# Patient Record
Sex: Male | Born: 1975 | Race: White | Hispanic: No | Marital: Married
Health system: Southern US, Community
[De-identification: ages and names within clinical notes are randomized; demographics above are authoritative.]

## PROBLEM LIST (undated history)

## (undated) DIAGNOSIS — I10 Essential (primary) hypertension: Secondary | ICD-10-CM

## (undated) DIAGNOSIS — N289 Disorder of kidney and ureter, unspecified: Secondary | ICD-10-CM

## (undated) HISTORY — PX: COLON SURGERY: SHX602

---

## 2005-02-26 ENCOUNTER — Ambulatory Visit: Payer: Self-pay | Admitting: General Surgery

## 2007-11-24 ENCOUNTER — Ambulatory Visit: Payer: Self-pay | Admitting: Unknown Physician Specialty

## 2009-10-18 ENCOUNTER — Ambulatory Visit: Payer: Self-pay | Admitting: Internal Medicine

## 2010-03-10 ENCOUNTER — Ambulatory Visit: Payer: Self-pay | Admitting: Gastroenterology

## 2010-05-23 ENCOUNTER — Ambulatory Visit: Payer: Self-pay | Admitting: Gastroenterology

## 2010-09-18 ENCOUNTER — Emergency Department: Payer: Self-pay | Admitting: Emergency Medicine

## 2010-10-10 ENCOUNTER — Ambulatory Visit: Payer: Self-pay | Admitting: Gastroenterology

## 2010-10-24 ENCOUNTER — Ambulatory Visit: Payer: Self-pay | Admitting: Gastroenterology

## 2010-10-28 LAB — PATHOLOGY REPORT

## 2010-11-02 ENCOUNTER — Ambulatory Visit: Payer: Self-pay | Admitting: Surgery

## 2010-11-09 ENCOUNTER — Inpatient Hospital Stay: Payer: Self-pay | Admitting: Surgery

## 2010-11-14 LAB — PATHOLOGY REPORT

## 2011-05-04 IMAGING — CT CT ABD-PELV W/ CM
1 of 2 series · 15 of 32 positions shown, 19 images · non-contrast
Comparison: none

REASON FOR EXAM: ADD ON CR  [id number]   [id number]   LLQ abd pain eval
diverticulitis
COMMENTS:

[Series 2: 3mm soft tissue · axial · 0.82mm/px · z∈[-639,-216]mm · 15 of 155 slices shown, 19 images]
[im 7/155  soft-tissue]
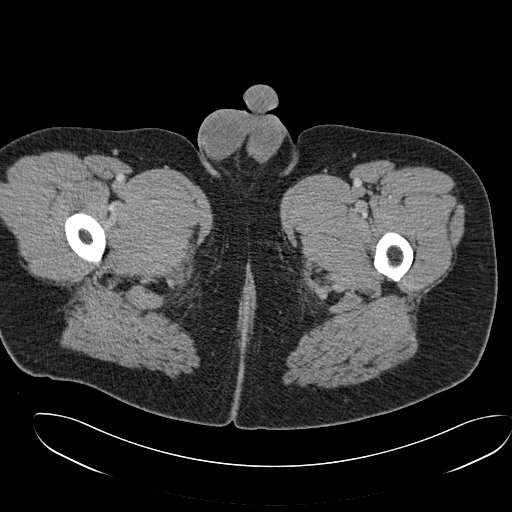
[im 7/155  bone]
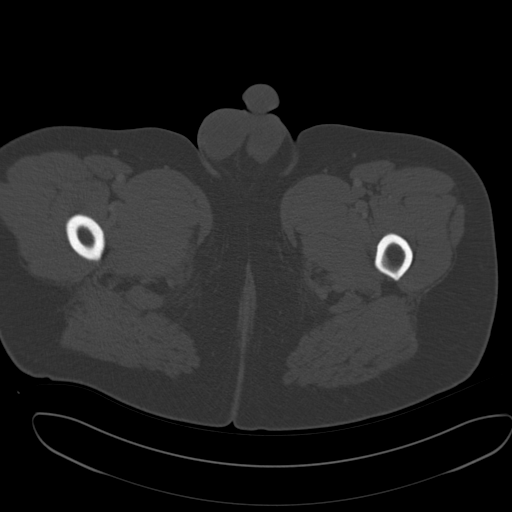
[im 20/155  soft-tissue]
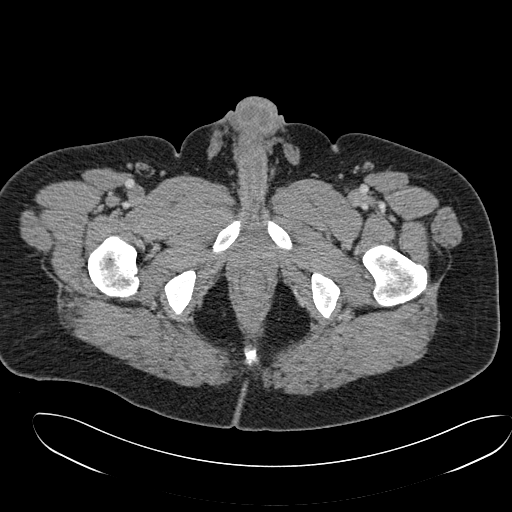
[im 33/155  soft-tissue]
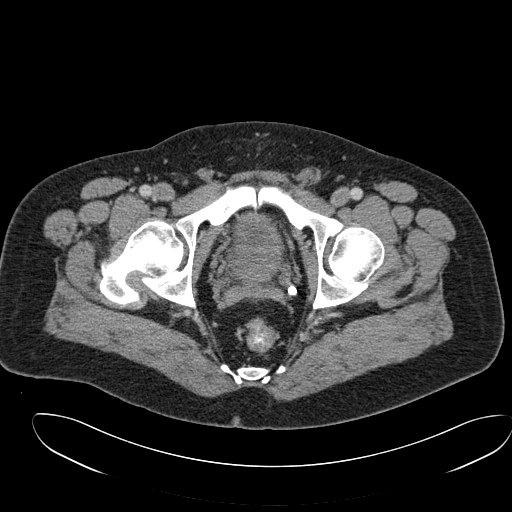
[im 45/155  soft-tissue]
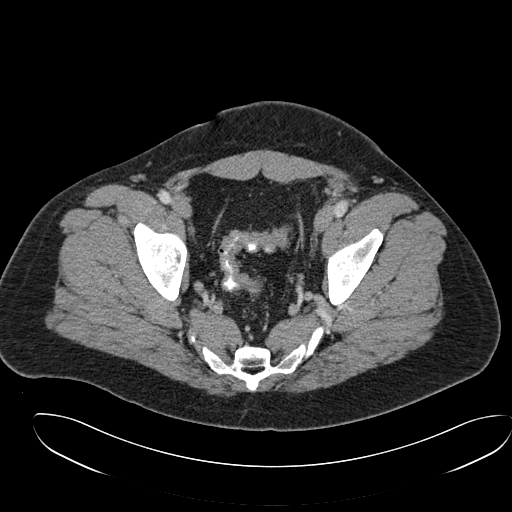
[im 52/155  soft-tissue]
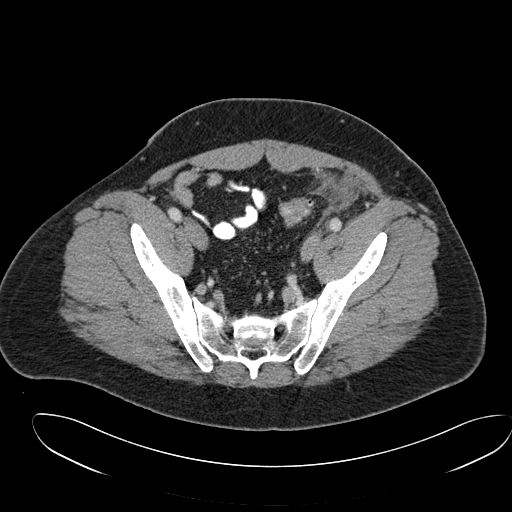
[im 65/155  soft-tissue]
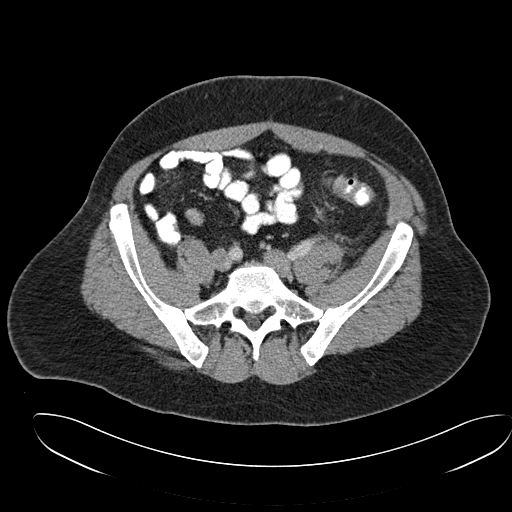
[im 78/155  soft-tissue]
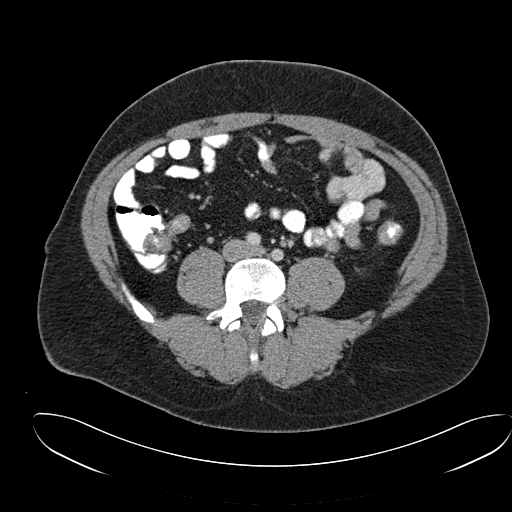
[im 90/155  soft-tissue]
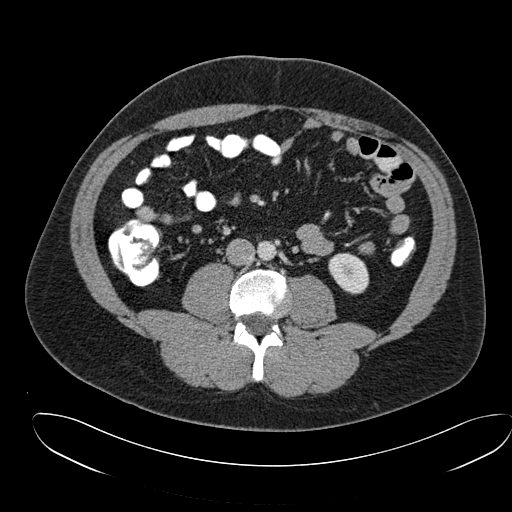
[im 103/155  soft-tissue]
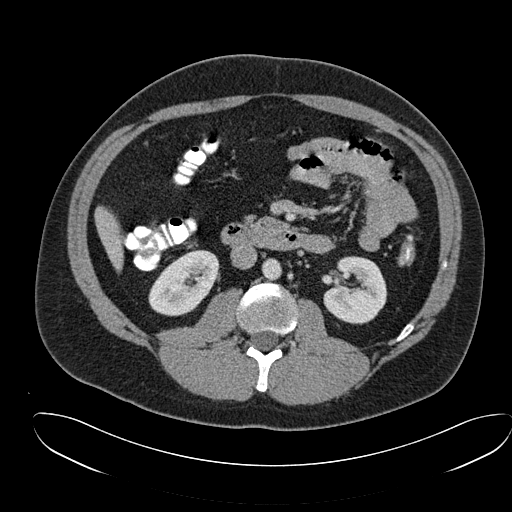
[im 103/155  bone]
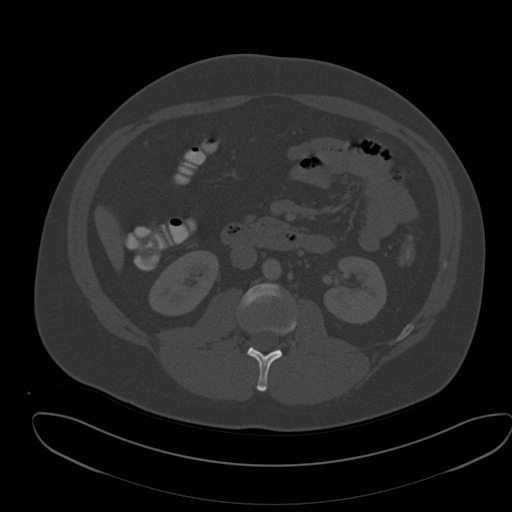
[im 110/155  soft-tissue]
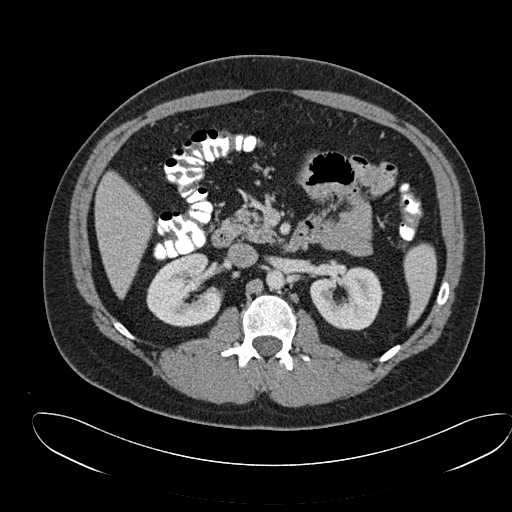
[im 122/155  soft-tissue]
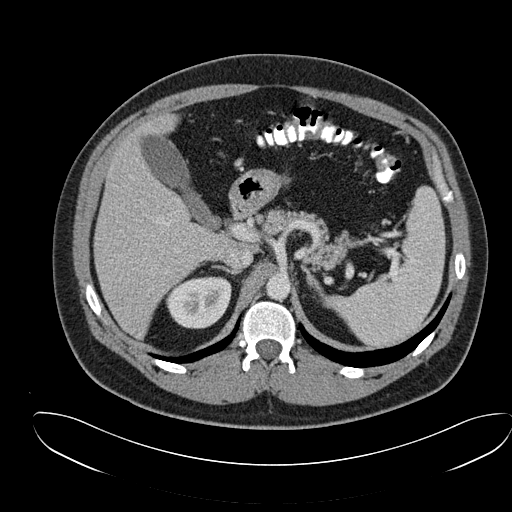
[im 129/155  lung]
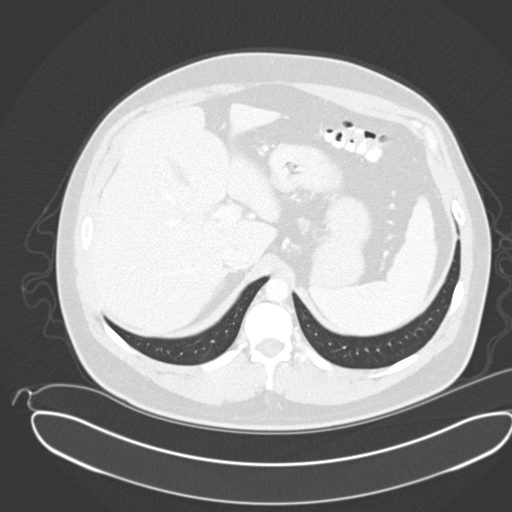
[im 135/155  soft-tissue]
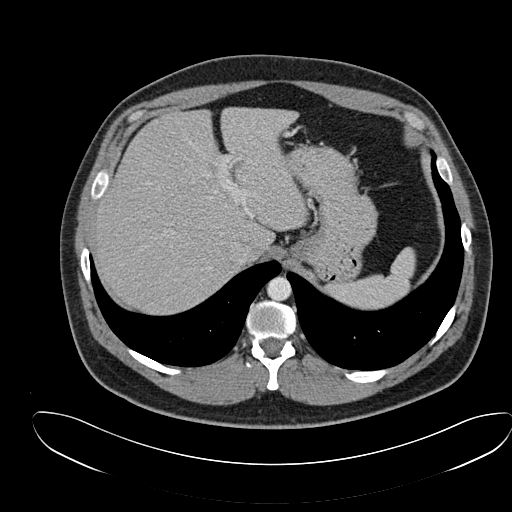
[im 135/155  lung]
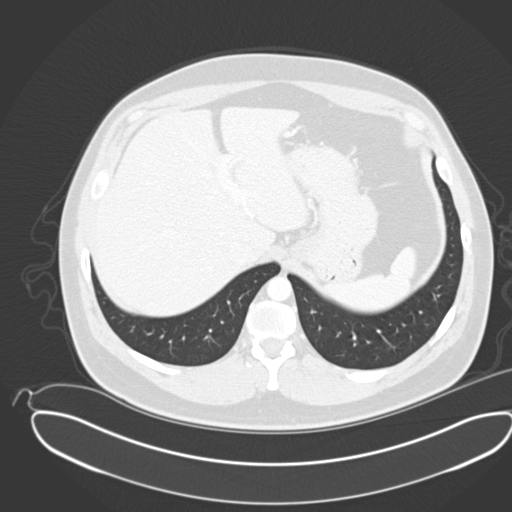
[im 142/155  lung]
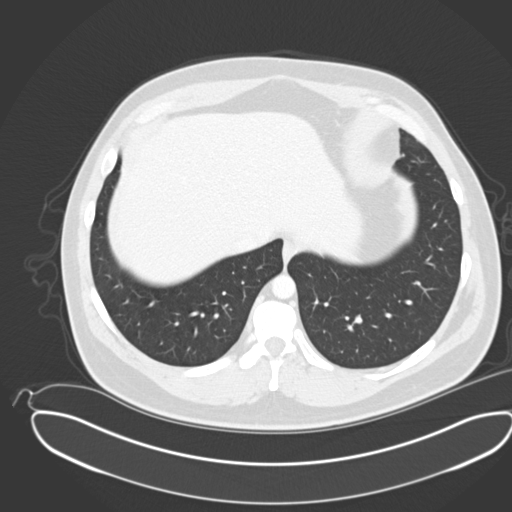
[im 148/155  soft-tissue]
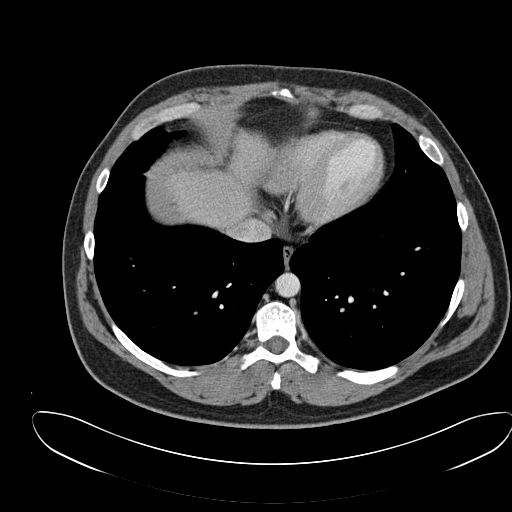
[im 148/155  lung]
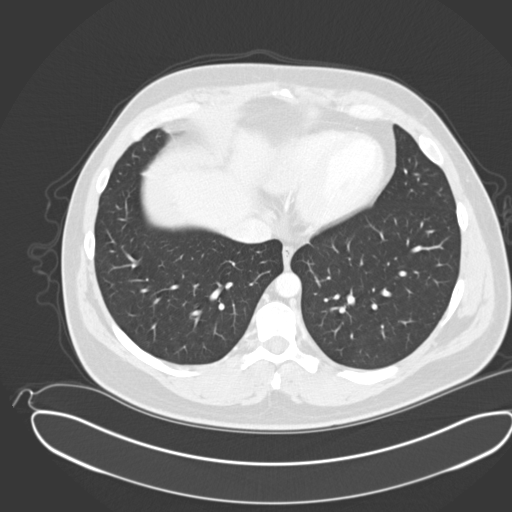

[15 of 32 positions shown; findings below may reference images not displayed]

PROCEDURE:     CT  - CT ABDOMEN / PELVIS  W  - October 10, 2010  [DATE]

RESULT:     CT of the abdomen and pelvis is performed utilizing oral
contrast and 100 mL of 8sovue-QII iodinated intravenous contrast. Images are
reconstructed at 3 mm slice thickness in the axial plane and compared to
previous examinations from 09/28/2010 and 10/18/2009.

Is again inflammatory change in the left lower quadrant pericolonic region
with thickening of the wall of the bowel in that region of the junction of
the distal descending and proximal sigmoid colons. Differential
considerations are for diverticulitis or colitis. There do not appear to be
a large number of diverticula present. There is inflammatory stranding which
is greater than seen previously without evidence of abscess formation or
perforation. There is no evidence of bowel obstruction. Oral contrast is
passed to the rectum. The urinary bladder, prostate and seminal vesicles
appear unremarkable. The aorta is normal in caliber. The kidneys enhance
homogeneously and show no obstruction. The pancreas, liver, spleen, kidneys,
adrenal glands and lung bases appear unremarkable.
IMPRESSION: Persistent to slightly worse inflammatory changes in the
left lower quadrant with some thickening of the wall of the colon in that
region. Differential considerations are for diverticulitis and colitis.

## 2013-09-21 ENCOUNTER — Ambulatory Visit: Payer: Self-pay | Admitting: Physician Assistant

## 2016-01-04 DIAGNOSIS — E785 Hyperlipidemia, unspecified: Secondary | ICD-10-CM | POA: Insufficient documentation

## 2016-01-11 ENCOUNTER — Other Ambulatory Visit: Payer: Self-pay | Admitting: Internal Medicine

## 2016-01-11 ENCOUNTER — Other Ambulatory Visit: Payer: Self-pay | Admitting: Pediatrics

## 2016-01-11 DIAGNOSIS — G4733 Obstructive sleep apnea (adult) (pediatric): Secondary | ICD-10-CM | POA: Insufficient documentation

## 2016-01-11 DIAGNOSIS — R311 Benign essential microscopic hematuria: Secondary | ICD-10-CM

## 2016-01-18 ENCOUNTER — Ambulatory Visit: Payer: BC Managed Care – PPO

## 2016-01-26 ENCOUNTER — Ambulatory Visit: Payer: BC Managed Care – PPO

## 2020-11-02 ENCOUNTER — Other Ambulatory Visit: Payer: Self-pay | Admitting: Internal Medicine

## 2020-11-02 ENCOUNTER — Other Ambulatory Visit (HOSPITAL_COMMUNITY): Payer: Self-pay | Admitting: Internal Medicine

## 2020-11-02 DIAGNOSIS — R7989 Other specified abnormal findings of blood chemistry: Secondary | ICD-10-CM

## 2020-11-02 DIAGNOSIS — D696 Thrombocytopenia, unspecified: Secondary | ICD-10-CM

## 2020-11-02 DIAGNOSIS — R809 Proteinuria, unspecified: Secondary | ICD-10-CM

## 2020-11-15 ENCOUNTER — Other Ambulatory Visit: Payer: Self-pay

## 2020-11-15 ENCOUNTER — Ambulatory Visit
Admission: RE | Admit: 2020-11-15 | Discharge: 2020-11-15 | Disposition: A | Discharge status: Home / Self Care | Payer: BC Managed Care – PPO | Source: Ambulatory Visit | Attending: Internal Medicine | Admitting: Internal Medicine

## 2020-11-15 DIAGNOSIS — R809 Proteinuria, unspecified: Secondary | ICD-10-CM

## 2020-11-15 DIAGNOSIS — D696 Thrombocytopenia, unspecified: Secondary | ICD-10-CM | POA: Diagnosis present

## 2020-11-15 DIAGNOSIS — R7989 Other specified abnormal findings of blood chemistry: Secondary | ICD-10-CM | POA: Diagnosis present

## 2020-11-17 DIAGNOSIS — N1831 Chronic kidney disease, stage 3a: Secondary | ICD-10-CM | POA: Insufficient documentation

## 2021-07-13 DIAGNOSIS — I10 Essential (primary) hypertension: Secondary | ICD-10-CM | POA: Insufficient documentation

## 2021-07-13 DIAGNOSIS — R7303 Prediabetes: Secondary | ICD-10-CM | POA: Insufficient documentation

## 2022-07-01 ENCOUNTER — Emergency Department
Admission: EM | Admit: 2022-07-01 | Discharge: 2022-07-01 | Disposition: A | Discharge status: Home / Self Care | Payer: BC Managed Care – PPO | Attending: Emergency Medicine | Admitting: Emergency Medicine

## 2022-07-01 ENCOUNTER — Other Ambulatory Visit: Payer: Self-pay

## 2022-07-01 ENCOUNTER — Emergency Department: Payer: BC Managed Care – PPO

## 2022-07-01 ENCOUNTER — Encounter: Payer: Self-pay | Admitting: Emergency Medicine

## 2022-07-01 DIAGNOSIS — W3409XA Accidental discharge from other specified firearms, initial encounter: Secondary | ICD-10-CM | POA: Insufficient documentation

## 2022-07-01 DIAGNOSIS — Z23 Encounter for immunization: Secondary | ICD-10-CM | POA: Diagnosis not present

## 2022-07-01 DIAGNOSIS — S21141A Puncture wound with foreign body of right front wall of thorax without penetration into thoracic cavity, initial encounter: Secondary | ICD-10-CM | POA: Diagnosis not present

## 2022-07-01 DIAGNOSIS — S20351A Superficial foreign body of right front wall of thorax, initial encounter: Secondary | ICD-10-CM

## 2022-07-01 DIAGNOSIS — S299XXA Unspecified injury of thorax, initial encounter: Secondary | ICD-10-CM | POA: Diagnosis present

## 2022-07-01 HISTORY — DX: Essential (primary) hypertension: I10

## 2022-07-01 HISTORY — DX: Disorder of kidney and ureter, unspecified: N28.9

## 2022-07-01 MED ORDER — TETANUS-DIPHTH-ACELL PERTUSSIS 5-2.5-18.5 LF-MCG/0.5 IM SUSY
0.5000 mL | PREFILLED_SYRINGE | Freq: Once | INTRAMUSCULAR | Status: AC
  Administered 2022-07-01: 0.5 mL via INTRAMUSCULAR
  Filled 2022-07-01: qty 0.5
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
## 2022-07-01 NOTE — Discharge Instructions (Addendum)
The fragment is very small and it looks like it is located in front of your clavicle.  It should not cause any problems there.  If it begins to bother you you can follow-up with Dr. Tonna Boehringer the surgeon on-call today.  He was the gentleman that came in with me to see you.  If you should get short of breath or develop redness or swelling or drainage from the wound or tenderness please return here.  Those could be signs of a collapsed lung or infection.  The way everything looks now there should not be any problem with collapsed lung at all and the chances of infection are quite slight.  The fragment is not anywhere near the long its in front of the clavicle and should not cause any major problems there at all.  As we discussed it may rub if you carry a pack but it may not either.
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
## 2022-07-01 NOTE — ED Notes (Signed)
Dr. Darnelle Catalan at bedside with ultrasound machine.

## 2022-07-01 NOTE — ED Notes (Signed)
Patient was able to walk from  x-ray w/o c/o of shortness of breath. Wound is not bleeding at this time. No discoloration noted beyond the wound on right upper chest. Wound approximately is the size of 57mm in diameter with red discoloration surrounding wound.

## 2022-07-25 LAB — COLOGUARD: COLOGUARD: NEGATIVE

## 2022-07-25 LAB — EXTERNAL GENERIC LAB PROCEDURE: COLOGUARD: NEGATIVE

## 2022-07-30 ENCOUNTER — Other Ambulatory Visit: Payer: Self-pay | Admitting: Physician Assistant

## 2022-07-30 ENCOUNTER — Ambulatory Visit
Admission: RE | Admit: 2022-07-30 | Discharge: 2022-07-30 | Disposition: A | Discharge status: Home / Self Care | Payer: BC Managed Care – PPO | Source: Ambulatory Visit | Attending: Physician Assistant | Admitting: Physician Assistant

## 2022-07-30 DIAGNOSIS — M79604 Pain in right leg: Secondary | ICD-10-CM | POA: Insufficient documentation

## 2023-10-01 ENCOUNTER — Encounter
Admission: RE | Disposition: A | Discharge status: Home / Self Care | Payer: Self-pay | Source: Home / Self Care | Attending: Cardiology

## 2023-10-01 ENCOUNTER — Encounter: Payer: Self-pay | Admitting: Cardiology

## 2023-10-01 ENCOUNTER — Ambulatory Visit
Admission: RE | Admit: 2023-10-01 | Discharge: 2023-10-01 | Disposition: A | Discharge status: Home / Self Care | Payer: BC Managed Care – PPO | Attending: Cardiology | Admitting: Cardiology

## 2023-10-01 ENCOUNTER — Other Ambulatory Visit: Payer: Self-pay

## 2023-10-01 DIAGNOSIS — R079 Chest pain, unspecified: Secondary | ICD-10-CM | POA: Diagnosis present

## 2023-10-01 DIAGNOSIS — I351 Nonrheumatic aortic (valve) insufficiency: Secondary | ICD-10-CM | POA: Insufficient documentation

## 2023-10-01 DIAGNOSIS — I2510 Atherosclerotic heart disease of native coronary artery without angina pectoris: Secondary | ICD-10-CM | POA: Insufficient documentation

## 2023-10-01 HISTORY — PX: LEFT HEART CATH AND CORONARY ANGIOGRAPHY: CATH118249

## 2023-10-01 SURGERY — LEFT HEART CATH AND CORONARY ANGIOGRAPHY
Anesthesia: Moderate Sedation | Laterality: Left

## 2023-10-01 MED ORDER — DIPHENHYDRAMINE HCL 50 MG/ML IJ SOLN
INTRAMUSCULAR | Status: AC
  Filled 2023-10-01: qty 1

## 2023-10-01 MED ORDER — SODIUM CHLORIDE 0.9 % IV SOLN: 250.0000 mL | INTRAVENOUS | Status: DC | PRN

## 2023-10-01 MED ORDER — ONDANSETRON HCL 4 MG/2ML IJ SOLN: 4.0000 mg | Freq: Four times a day (QID) | INTRAMUSCULAR | Status: DC | PRN

## 2023-10-01 MED ORDER — MIDAZOLAM HCL 2 MG/2ML IJ SOLN
INTRAMUSCULAR | Status: DC | PRN
  Administered 2023-10-01 (×2): .5 mg via INTRAVENOUS

## 2023-10-01 MED ORDER — LIDOCAINE HCL 1 % IJ SOLN
INTRAMUSCULAR | Status: DC | PRN
  Administered 2023-10-01: 3 mL

## 2023-10-01 MED ORDER — SODIUM CHLORIDE 0.9 % WEIGHT BASED INFUSION: 1.0000 mL/kg/h | INTRAVENOUS | Status: DC

## 2023-10-01 MED ORDER — SODIUM CHLORIDE 0.9% FLUSH: 3.0000 mL | INTRAVENOUS | Status: DC | PRN

## 2023-10-01 MED ORDER — METHYLPREDNISOLONE SODIUM SUCC 125 MG IJ SOLR
INTRAMUSCULAR | Status: AC
  Filled 2023-10-01: qty 2

## 2023-10-01 MED ORDER — SODIUM CHLORIDE 0.9% FLUSH: 3.0000 mL | Freq: Two times a day (BID) | INTRAVENOUS | Status: DC

## 2023-10-01 MED ORDER — FENTANYL CITRATE (PF) 100 MCG/2ML IJ SOLN
INTRAMUSCULAR | Status: AC
  Filled 2023-10-01: qty 2

## 2023-10-01 MED ORDER — DIPHENHYDRAMINE HCL 50 MG/ML IJ SOLN
50.0000 mg | Freq: Once | INTRAMUSCULAR | Status: AC
  Administered 2023-10-01: 50 mg via INTRAVENOUS

## 2023-10-01 MED ORDER — FENTANYL CITRATE (PF) 100 MCG/2ML IJ SOLN
INTRAMUSCULAR | Status: DC | PRN
  Administered 2023-10-01 (×2): 12.5 ug via INTRAVENOUS

## 2023-10-01 MED ORDER — HYDRALAZINE HCL 20 MG/ML IJ SOLN: 10.0000 mg | INTRAMUSCULAR | Status: DC | PRN

## 2023-10-01 MED ORDER — MIDAZOLAM HCL 2 MG/2ML IJ SOLN
INTRAMUSCULAR | Status: AC
  Filled 2023-10-01: qty 2

## 2023-10-01 MED ORDER — HEPARIN (PORCINE) IN NACL 2000-0.9 UNIT/L-% IV SOLN
INTRAVENOUS | Status: DC | PRN
  Administered 2023-10-01: 1000 mL

## 2023-10-01 MED ORDER — LABETALOL HCL 5 MG/ML IV SOLN: 10.0000 mg | INTRAVENOUS | Status: DC | PRN

## 2023-10-01 MED ORDER — HEPARIN SODIUM (PORCINE) 1000 UNIT/ML IJ SOLN
INTRAMUSCULAR | Status: AC
  Filled 2023-10-01: qty 10

## 2023-10-01 MED ORDER — ASPIRIN 81 MG PO CHEW: 81.0000 mg | CHEWABLE_TABLET | ORAL | Status: DC

## 2023-10-01 MED ORDER — METHYLPREDNISOLONE SODIUM SUCC 125 MG IJ SOLR
125.0000 mg | Freq: Once | INTRAMUSCULAR | Status: AC
  Administered 2023-10-01: 125 mg via INTRAVENOUS

## 2023-10-01 MED ORDER — HEPARIN SODIUM (PORCINE) 1000 UNIT/ML IJ SOLN
INTRAMUSCULAR | Status: DC | PRN
  Administered 2023-10-01: 5000 [IU] via INTRAVENOUS

## 2023-10-01 MED ORDER — LIDOCAINE HCL 1 % IJ SOLN
INTRAMUSCULAR | Status: AC
  Filled 2023-10-01: qty 20

## 2023-10-01 MED ORDER — IOHEXOL 300 MG/ML  SOLN
INTRAMUSCULAR | Status: DC | PRN
  Administered 2023-10-01: 122 mL

## 2023-10-01 MED ORDER — ACETAMINOPHEN 325 MG PO TABS: 650.0000 mg | ORAL_TABLET | ORAL | Status: DC | PRN

## 2023-10-01 MED ORDER — HEPARIN (PORCINE) IN NACL 1000-0.9 UT/500ML-% IV SOLN
INTRAVENOUS | Status: AC
  Filled 2023-10-01: qty 1000

## 2023-10-01 MED ORDER — VERAPAMIL HCL 2.5 MG/ML IV SOLN
INTRAVENOUS | Status: AC
  Filled 2023-10-01: qty 2

## 2023-10-01 MED ORDER — SODIUM CHLORIDE 0.9 % WEIGHT BASED INFUSION
3.0000 mL/kg/h | INTRAVENOUS | Status: AC
  Administered 2023-10-01: 3 mL/kg/h via INTRAVENOUS

## 2023-10-01 SURGICAL SUPPLY — 10 items
CATH INFINITI 5FR MULTPACK ANG (CATHETERS) IMPLANT
DEVICE RAD TR BAND REGULAR (VASCULAR PRODUCTS) IMPLANT
DRAPE BRACHIAL (DRAPES) IMPLANT
GLIDESHEATH SLEND SS 6F .021 (SHEATH) IMPLANT
GUIDEWIRE INQWIRE 1.5J.035X260 (WIRE) IMPLANT
INQWIRE 1.5J .035X260CM (WIRE) ×1
PACK CARDIAC CATH (CUSTOM PROCEDURE TRAY) ×1 IMPLANT
PROTECTION STATION PRESSURIZED (MISCELLANEOUS) ×1
SET ATX-X65L (MISCELLANEOUS) IMPLANT
STATION PROTECTION PRESSURIZED (MISCELLANEOUS) IMPLANT

## 2023-10-01 NOTE — Progress Notes (Signed)
Pt. Concerned about "chronic issues with my platelts" MD made aware: ASA cancelled for this procedure per MD orders. Pt. To be premedicated for IVP dye allergy. Pt. States "I only had an itchy eye a long time ago when I had contrast."

## 2023-10-02 ENCOUNTER — Encounter: Payer: Self-pay | Admitting: Cardiology

## 2023-10-30 DIAGNOSIS — Z952 Presence of prosthetic heart valve: Secondary | ICD-10-CM | POA: Insufficient documentation

## 2023-11-28 ENCOUNTER — Encounter: Payer: 59 | Attending: Cardiology | Admitting: *Deleted

## 2023-11-28 DIAGNOSIS — Z7901 Long term (current) use of anticoagulants: Secondary | ICD-10-CM | POA: Insufficient documentation

## 2023-11-28 DIAGNOSIS — Z952 Presence of prosthetic heart valve: Secondary | ICD-10-CM | POA: Insufficient documentation

## 2023-11-28 DIAGNOSIS — J45909 Unspecified asthma, uncomplicated: Secondary | ICD-10-CM | POA: Insufficient documentation

## 2023-11-28 DIAGNOSIS — F419 Anxiety disorder, unspecified: Secondary | ICD-10-CM | POA: Insufficient documentation

## 2023-11-28 DIAGNOSIS — K219 Gastro-esophageal reflux disease without esophagitis: Secondary | ICD-10-CM | POA: Insufficient documentation

## 2023-11-28 DIAGNOSIS — Z48812 Encounter for surgical aftercare following surgery on the circulatory system: Secondary | ICD-10-CM | POA: Insufficient documentation

## 2023-11-28 NOTE — Progress Notes (Signed)
Initial phone call completed. Diagnosis can be found in [facility] 12/17. EP Orientation scheduled for Wednesday 1/22 at 8:30.

## 2023-12-04 ENCOUNTER — Ambulatory Visit: Payer: Self-pay

## 2023-12-11 VITALS — Ht 67.25 in | Wt 268.0 lb

## 2023-12-11 DIAGNOSIS — Z48812 Encounter for surgical aftercare following surgery on the circulatory system: Secondary | ICD-10-CM | POA: Diagnosis present

## 2023-12-11 DIAGNOSIS — Z952 Presence of prosthetic heart valve: Secondary | ICD-10-CM

## 2023-12-11 DIAGNOSIS — Z7901 Long term (current) use of anticoagulants: Secondary | ICD-10-CM | POA: Diagnosis not present

## 2023-12-11 NOTE — Progress Notes (Signed)
Cardiac Individual Treatment Plan  Patient Details  Name: Jackson Hoffman MRN: [id number] Date of Birth: Feb 08, 1976 Referring Provider:   Flowsheet Row Cardiac Rehab from 12/11/2023 in [facility] Cardiac and Pulmonary Rehab  Referring Provider Dr. Marcina Millard       Initial Encounter Date:  Flowsheet Row Cardiac Rehab from 12/11/2023 in [facility] Cardiac and Pulmonary Rehab  Date 12/11/23       Visit Diagnosis: S/P AVR (aortic valve replacement)  Patient's Home Medications on Admission:  Current Outpatient Medications:    albuterol (VENTOLIN HFA) 108 (90 Base) MCG/ACT inhaler, Inhale into the lungs. (Patient not taking: Reported on 11/28/2023), Disp: , Rfl:    amLODipine (NORVASC) 10 MG tablet, Take 1 tablet by mouth daily., Disp: , Rfl:    MAGNESIUM GLYCINATE PO, Take 400 mg by mouth 3 (three) times a week., Disp: , Rfl:    methocarbamol (ROBAXIN) 500 MG tablet, Take 500 mg by mouth 3 (three) times daily as needed., Disp: , Rfl:    metoprolol tartrate (LOPRESSOR) 25 MG tablet, Take 50 mg by mouth 2 (two) times daily., Disp: , Rfl:    Milk Thistle 1000 MG CAPS, Take 1,000 mg by mouth daily., Disp: , Rfl:    olmesartan (BENICAR) 5 MG tablet, Take 15 mg by mouth at bedtime., Disp: , Rfl:    omeprazole (PRILOSEC OTC) 20 MG tablet, Take 20 mg by mouth 3 (three) times a week., Disp: , Rfl:    Probiotic Product (PROBIOTIC PO), Take 1 capsule by mouth daily., Disp: , Rfl:    Vitamin D-Vitamin K (VITAMIN K2-VITAMIN D3 PO), Take 1 tablet by mouth daily. D3 4000 units K2 100 mg, Disp: , Rfl:    warfarin (COUMADIN) 2 MG tablet, Take 7 mg by mouth daily., Disp: , Rfl:   Past Medical History: Past Medical History:  Diagnosis Date   Hypertension    Renal disorder     Tobacco Use: Social History   Tobacco Use  Smoking Status Former   Average packs/day: 0.3 packs/day for 36.0 years (9.0 ttl pk-yrs)   Types: Cigarettes   Start date: 1984  Smokeless Tobacco Former   Quit date: 2000   Tobacco Comments   Dipped when he'd go fishing     Labs: Review Flowsheet        No data to display           Exercise Target Goals: Exercise Program Goal: Individual exercise prescription set using results from initial 6 min walk test and THRR while considering  patient's activity barriers and safety.   Exercise Prescription Goal: Initial exercise prescription builds to 30-45 minutes a day of aerobic activity, 2-3 days per week.  Home exercise guidelines will be given to patient during program as part of exercise prescription that the participant will acknowledge.   Education: Aerobic Exercise: - Group verbal and visual presentation on the components of exercise prescription. Introduces F.I.T.T principle from ACSM for exercise prescriptions.  Reviews F.I.T.T. principles of aerobic exercise including progression. Written material given at graduation. Flowsheet Row Cardiac Rehab from 12/11/2023 in [facility] Cardiac and Pulmonary Rehab  Education need identified 12/11/23       Education: Resistance Exercise: - Group verbal and visual presentation on the components of exercise prescription. Introduces F.I.T.T principle from ACSM for exercise prescriptions  Reviews F.I.T.T. principles of resistance exercise including progression. Written material given at graduation.    Education: Exercise & Equipment Safety: - Individual verbal instruction and demonstration of equipment use and safety with use  of the equipment. Flowsheet Row Cardiac Rehab from 12/11/2023 in [facility] Cardiac and Pulmonary Rehab  Date 12/11/23  Educator NT  Instruction Review Code 1- Verbalizes Understanding       Education: Exercise Physiology & General Exercise Guidelines: - Group verbal and written instruction with models to review the exercise physiology of the cardiovascular system and associated critical values. Provides general exercise guidelines with specific guidelines to those with heart or lung disease.     Education: Flexibility, Balance, Mind/Body Relaxation: - Group verbal and visual presentation with interactive activity on the components of exercise prescription. Introduces F.I.T.T principle from ACSM for exercise prescriptions. Reviews F.I.T.T. principles of flexibility and balance exercise training including progression. Also discusses the mind body connection.  Reviews various relaxation techniques to help reduce and manage stress (i.e. Deep breathing, progressive muscle relaxation, and visualization). Balance handout provided to take home. Written material given at graduation.   Activity Barriers & Risk Stratification:  Activity Barriers & Cardiac Risk Stratification - 11/28/23 1025       Activity Barriers & Cardiac Risk Stratification   Activity Barriers None    Cardiac Risk Stratification Moderate   complicated recovery from AVR, HTN issues            6 Minute Walk:  6 Minute Walk     Row Name 12/11/23 1124         6 Minute Walk   Phase Initial     Distance 1130 feet     Walk Time 6 minutes     # of Rest Breaks 0     MPH 2.14     METS 2.87     RPE 11     Perceived Dyspnea  1     VO2 Peak 10.03     Symptoms Yes (comment)     Comments chest sensitivity     Resting HR 64 bpm     Resting BP 104/56     Resting Oxygen Saturation  96 %     Exercise Oxygen Saturation  during 6 min walk 96 %     Max Ex. HR 92 bpm     Max Ex. BP 118/58     2 Minute Post BP 100/56              Oxygen Initial Assessment:   Oxygen Re-Evaluation:   Oxygen Discharge (Final Oxygen Re-Evaluation):   Initial Exercise Prescription:  Initial Exercise Prescription - 12/11/23 1100       Date of Initial Exercise RX and Referring Provider   Date 12/11/23    Referring Provider Dr. Marcina Millard      Oxygen   Maintain Oxygen Saturation 88% or higher      Treadmill   MPH 2.4    Grade 0    Minutes 15    METs 2.84      REL-XR   Level 2    Speed 50    Minutes 15     METs 2.87      T5 Nustep   Level 1    SPM 80    Minutes 15    METs 2.87      Prescription Details   Frequency (times per week) 3    Duration Progress to 30 minutes of continuous aerobic without signs/symptoms of physical distress      Intensity   THRR 40-80% of Max Heartrate 107-151    Ratings of Perceived Exertion 11-13    Perceived Dyspnea 0-4  Progression   Progression Continue to progress workloads to maintain intensity without signs/symptoms of physical distress.      Resistance Training   Training Prescription Yes    Weight 7 lb    Reps 10-15             Perform Capillary Blood Glucose checks as needed.  Exercise Prescription Changes:   Exercise Prescription Changes     Row Name 12/11/23 1100             Response to Exercise   Blood Pressure (Admit) 104/56       Blood Pressure (Exercise) 118/58       Blood Pressure (Exit) 100/56       Heart Rate (Admit) 64 bpm       Heart Rate (Exercise) 92 bpm       Heart Rate (Exit) 64 bpm       Oxygen Saturation (Admit) 96 %       Oxygen Saturation (Exercise) 96 %       Rating of Perceived Exertion (Exercise) 11       Perceived Dyspnea (Exercise) 1       Symptoms chest sensitivity       Comments Results                Exercise Comments:   Exercise Goals and Review:   Exercise Goals     Row Name 12/11/23 1126             Exercise Goals   Increase Physical Activity Yes       Intervention Provide advice, education, support and counseling about physical activity/exercise needs.;Develop an individualized exercise prescription for aerobic and resistive training based on initial evaluation findings, risk stratification, comorbidities and participant's personal goals.       Expected Outcomes Short Term: Attend rehab on a regular basis to increase amount of physical activity.;Long Term: Add in home exercise to make exercise part of routine and to increase amount of physical activity.;Long  Term: Exercising regularly at least 3-5 days a week.       Increase Strength and Stamina Yes       Intervention Develop an individualized exercise prescription for aerobic and resistive training based on initial evaluation findings, risk stratification, comorbidities and participant's personal goals.;Provide advice, education, support and counseling about physical activity/exercise needs.       Expected Outcomes Short Term: Increase workloads from initial exercise prescription for resistance, speed, and METs.;Long Term: Improve cardiorespiratory fitness, muscular endurance and strength as measured by increased METs and functional capacity ( );Short Term: Perform resistance training exercises routinely during rehab and add in resistance training at home       Able to understand and use rate of perceived exertion (RPE) scale Yes       Intervention Provide education and explanation on how to use RPE scale       Expected Outcomes Short Term: Able to use RPE daily in rehab to express subjective intensity level;Long Term:  Able to use RPE to guide intensity level when exercising independently       Able to understand and use Dyspnea scale Yes       Intervention Provide education and explanation on how to use Dyspnea scale       Expected Outcomes Short Term: Able to use Dyspnea scale daily in rehab to express subjective sense of shortness of breath during exertion;Long Term: Able to use Dyspnea scale to guide intensity level when exercising independently  Knowledge and understanding of Target Heart Rate Range (THRR) Yes       Intervention Provide education and explanation of THRR including how the numbers were predicted and where they are located for reference       Expected Outcomes Short Term: Able to state/look up THRR;Long Term: Able to use THRR to govern intensity when exercising independently;Short Term: Able to use daily as guideline for intensity in rehab       Able to check pulse independently  Yes       Intervention Provide education and demonstration on how to check pulse in carotid and radial arteries.;Review the importance of being able to check your own pulse for safety during independent exercise       Expected Outcomes Short Term: Able to explain why pulse checking is important during independent exercise;Long Term: Able to check pulse independently and accurately       Understanding of Exercise Prescription Yes       Intervention Provide education, explanation, and written materials on patient's individual exercise prescription       Expected Outcomes Short Term: Able to explain program exercise prescription;Long Term: Able to explain home exercise prescription to exercise independently                Exercise Goals Re-Evaluation :   Discharge Exercise Prescription (Final Exercise Prescription Changes):  Exercise Prescription Changes - 12/11/23 1100       Response to Exercise   Blood Pressure (Admit) 104/56    Blood Pressure (Exercise) 118/58    Blood Pressure (Exit) 100/56    Heart Rate (Admit) 64 bpm    Heart Rate (Exercise) 92 bpm    Heart Rate (Exit) 64 bpm    Oxygen Saturation (Admit) 96 %    Oxygen Saturation (Exercise) 96 %    Rating of Perceived Exertion (Exercise) 11    Perceived Dyspnea (Exercise) 1    Symptoms chest sensitivity    Comments Results             Nutrition:  Target Goals: Understanding of nutrition guidelines, daily intake of sodium 1500mg , cholesterol 200mg , calories 30% from fat and 7% or less from saturated fats, daily to have 5 or more servings of fruits and vegetables.  Education: All About Nutrition: -Group instruction provided by verbal, written material, interactive activities, discussions, models, and posters to present general guidelines for heart healthy nutrition including fat, fiber, MyPlate, the role of sodium in heart healthy nutrition, utilization of the nutrition label, and utilization of this knowledge for  meal planning. Follow up email sent as well. Written material given at graduation.   Biometrics:  Pre Biometrics - 12/11/23 1129       Pre Biometrics   Height 5' 7.25" (1.708 m)    Weight 268 lb (121.6 kg)    Waist Circumference 46 inches    Hip Circumference 48 inches    Waist to Hip Ratio 0.96 %    BMI (Calculated) 41.67    Single Leg Stand 30 seconds              Nutrition Therapy Plan and Nutrition Goals:  Nutrition Therapy & Goals - 12/11/23 1121       Intervention Plan   Intervention Prescribe, educate and counsel regarding individualized specific dietary modifications aiming towards targeted core components such as weight, hypertension, lipid management, diabetes, heart failure and other comorbidities.    Expected Outcomes Short Term Goal: Understand basic principles of dietary content, such as calories,  fat, sodium, cholesterol and nutrients.;Short Term Goal: A plan has been developed with personal nutrition goals set during dietitian appointment.;Long Term Goal: Adherence to prescribed nutrition plan.             Nutrition Assessments:  MEDIFICTS Score Key: >=70 Need to make dietary changes  40-70 Heart Healthy Diet <= 40 Therapeutic Level Cholesterol Diet  Flowsheet Row Cardiac Rehab from 12/11/2023 in [facility] Cardiac and Pulmonary Rehab  Picture Your Plate Total Score on Admission 60      Picture Your Plate Scores: <65 Unhealthy dietary pattern with much room for improvement. 41-50 Dietary pattern unlikely to meet recommendations for good health and room for improvement. 51-60 More healthful dietary pattern, with some room for improvement.  >60 Healthy dietary pattern, although there may be some specific behaviors that could be improved.    Nutrition Goals Re-Evaluation:   Nutrition Goals Discharge (Final Nutrition Goals Re-Evaluation):   Psychosocial: Target Goals: Acknowledge presence or absence of significant depression and/or stress, maximize  coping skills, provide positive support system. Participant is able to verbalize types and ability to use techniques and skills needed for reducing stress and depression.   Education: Stress, Anxiety, and Depression - Group verbal and visual presentation to define topics covered.  Reviews how body is impacted by stress, anxiety, and depression.  Also discusses healthy ways to reduce stress and to treat/manage anxiety and depression.  Written material given at graduation.   Education: Sleep Hygiene -Provides group verbal and written instruction about how sleep can affect your health.  Define sleep hygiene, discuss sleep cycles and impact of sleep habits. Review good sleep hygiene tips.    Initial Review & Psychosocial Screening:  Initial Psych Review & Screening - 11/28/23 1007       Initial Review   Current issues with Current Stress Concerns;Current Sleep Concerns      Family Dynamics   Good Support System? Yes      Barriers   Psychosocial barriers to participate in program There are no identifiable barriers or psychosocial needs.      Screening Interventions   Interventions Encouraged to exercise    Expected Outcomes Short Term goal: Utilizing psychosocial counselor, staff and physician to assist with identification of specific Stressors or current issues interfering with healing process. Setting desired goal for each stressor or current issue identified.;Long Term Goal: Stressors or current issues are controlled or eliminated.;Short Term goal: Identification and review with participant of any Quality of Life or Depression concerns found by scoring the questionnaire.;Long Term goal: The participant improves quality of Life and PHQ9 Scores as seen by post scores and/or verbalization of changes             Quality of Life Scores:   Quality of Life - 12/11/23 1120       Quality of Life   Select Quality of Life      Quality of Life Scores   Health/Function Pre 20.8 %     Socioeconomic Pre 20.31 %    Psych/Spiritual Pre 20.57 %    Family Pre 26.4 %    GLOBAL Pre 21.44 %            Scores of 19 and below usually indicate a poorer quality of life in these areas.  A difference of  2-3 points is a clinically meaningful difference.  A difference of 2-3 points in the total score of the Quality of Life Index has been associated with significant improvement in overall quality of life,  self-image, physical symptoms, and general health in studies assessing change in quality of life.  PHQ-9: Review Flowsheet       12/11/2023  Depression screen PHQ 2/9  Decreased Interest 0  Down, Depressed, Hopeless 0  PHQ - 2 Score 0  Altered sleeping 1  Tired, decreased energy 2  Change in appetite 1  Feeling bad or failure about yourself  0  Trouble concentrating 1  Moving slowly or fidgety/restless 1  Suicidal thoughts 0  PHQ-9 Score 6  Difficult doing work/chores Not difficult at all   Interpretation of Total Score  Total Score Depression Severity:  1-4 = Minimal depression, 5-9 = Mild depression, 10-14 = Moderate depression, 15-19 = Moderately severe depression, 20-27 = Severe depression   Psychosocial Evaluation and Intervention:  Psychosocial Evaluation - 11/28/23 1036       Psychosocial Evaluation & Interventions   Interventions Encouraged to exercise with the program and follow exercise prescription;Stress management education;Relaxation education    Comments Mr. Jaggers is coming to cardiac rehab after an aortic valve replacement. He states recovery has been challenging regarding his blood pressure. He has been battling with high blood pressure since the hospital and he is slowly weaning himself off some of his medication under doctor supervision. He is ready to get back to his normal life, but knows that healing takes time. His copays might limit the length of the program, but he is ready to learn what he should be doing and more about nutrition. He enjoys  hiking with his family and friends and really wants to get back to it. His sleep has improved as he reduces the amount of metoprolol, but he is still having to get up to use the bathroom frequently which is new since surgery. He sees his doctor tomorrow and plans to ask some of his questions. He is motivated to attend the program for as long as he can financially.    Expected Outcomes Short: attend cardiac rehab for education and exercise. Long: develop and maintain positive self care habtis.    Continue Psychosocial Services  Follow up required by staff             Psychosocial Re-Evaluation:   Psychosocial Discharge (Final Psychosocial Re-Evaluation):   Vocational Rehabilitation: Provide vocational rehab assistance to qualifying candidates.   Vocational Rehab Evaluation & Intervention:  Vocational Rehab - 11/28/23 1004       Initial Vocational Rehab Evaluation & Intervention   Assessment shows need for Vocational Rehabilitation No             Education: Education Goals: Education classes will be provided on a variety of topics geared toward better understanding of heart health and risk factor modification. Participant will state understanding/return demonstration of topics presented as noted by education test scores.  Learning Barriers/Preferences:  Learning Barriers/Preferences - 11/28/23 1004       Learning Barriers/Preferences   Learning Barriers None    Learning Preferences None             General Cardiac Education Topics:  AED/CPR: - Group verbal and written instruction with the use of models to demonstrate the basic use of the AED with the basic ABC's of resuscitation.   Anatomy and Cardiac Procedures: - Group verbal and visual presentation and models provide information about basic cardiac anatomy and function. Reviews the testing methods done to diagnose heart disease and the outcomes of the test results. Describes the treatment choices: Medical  Management, Angioplasty, or Coronary Bypass Surgery for  treating various heart conditions including Myocardial Infarction, Angina, Valve Disease, and Cardiac Arrhythmias.  Written material given at graduation.   Medication Safety: - Group verbal and visual instruction to review commonly prescribed medications for heart and lung disease. Reviews the medication, class of the drug, and side effects. Includes the steps to properly store meds and maintain the prescription regimen.  Written material given at graduation.   Intimacy: - Group verbal instruction through game format to discuss how heart and lung disease can affect sexual intimacy. Written material given at graduation..   Know Your Numbers and Heart Failure: - Group verbal and visual instruction to discuss disease risk factors for cardiac and pulmonary disease and treatment options.  Reviews associated critical values for Overweight/Obesity, Hypertension, Cholesterol, and Diabetes.  Discusses basics of heart failure: signs/symptoms and treatments.  Introduces Heart Failure Zone chart for action plan for heart failure.  Written material given at graduation.   Infection Prevention: - Provides verbal and written material to individual with discussion of infection control including proper hand washing and proper equipment cleaning during exercise session. Flowsheet Row Cardiac Rehab from 12/11/2023 in [facility] Cardiac and Pulmonary Rehab  Date 12/11/23  Educator NT  Instruction Review Code 1- Verbalizes Understanding       Falls Prevention: - Provides verbal and written material to individual with discussion of falls prevention and safety. Flowsheet Row Cardiac Rehab from 12/11/2023 in [facility] Cardiac and Pulmonary Rehab  Date 12/11/23  Educator NT  Instruction Review Code 1- Verbalizes Understanding       Other: -Provides group and verbal instruction on various topics (see comments)   Knowledge Questionnaire Score:  Knowledge  Questionnaire Score - 12/11/23 1120       Knowledge Questionnaire Score   Pre Score 25/26             Core Components/Risk Factors/Patient Goals at Admission:  Personal Goals and Risk Factors at Admission - 11/28/23 1003       Core Components/Risk Factors/Patient Goals on Admission    Weight Management Yes;Weight Loss    Intervention Weight Management: Develop a combined nutrition and exercise program designed to reach desired caloric intake, while maintaining appropriate intake of nutrient and fiber, sodium and fats, and appropriate energy expenditure required for the weight goal.;Weight Management: Provide education and appropriate resources to help participant work on and attain dietary goals.;Weight Management/Obesity: Establish reasonable short term and long term weight goals.;Obesity: Provide education and appropriate resources to help participant work on and attain dietary goals.    Expected Outcomes Long Term: Adherence to nutrition and physical activity/exercise program aimed toward attainment of established weight goal;Short Term: Continue to assess and modify interventions until short term weight is achieved;Weight Loss: Understanding of general recommendations for a balanced deficit meal plan, which promotes 1-2 lb weight loss per week and includes a negative energy balance of [phone] kcal/d;Understanding recommendations for meals to include 15-35% energy as protein, 25-35% energy from fat, 35-60% energy from carbohydrates, less than 200mg  of dietary cholesterol, 20-35 gm of total fiber daily;Understanding of distribution of calorie intake throughout the day with the consumption of 4-5 meals/snacks    Hypertension Yes    Intervention Provide education on lifestyle modifcations including regular physical activity/exercise, weight management, moderate sodium restriction and increased consumption of fresh fruit, vegetables, and low fat dairy, alcohol moderation, and smoking  cessation.;Monitor prescription use compliance.    Expected Outcomes Short Term: Continued assessment and intervention until BP is < 140/62mm HG in hypertensive participants. < 130/54mm HG  in hypertensive participants with diabetes, heart failure or chronic kidney disease.;Long Term: Maintenance of blood pressure at goal levels.    Lipids Yes    Intervention Provide education and support for participant on nutrition & aerobic/resistive exercise along with prescribed medications to achieve LDL 70mg , HDL >40mg .    Expected Outcomes Short Term: Participant states understanding of desired cholesterol values and is compliant with medications prescribed. Participant is following exercise prescription and nutrition guidelines.;Long Term: Cholesterol controlled with medications as prescribed, with individualized exercise RX and with personalized nutrition plan. Value goals: LDL < 70mg , HDL > 40 mg.             Education:Diabetes - Individual verbal and written instruction to review signs/symptoms of diabetes, desired ranges of glucose level fasting, after meals and with exercise. Acknowledge that pre and post exercise glucose checks will be done for 3 sessions at entry of program.   Core Components/Risk Factors/Patient Goals Review:    Core Components/Risk Factors/Patient Goals at Discharge (Final Review):    ITP Comments:  ITP Comments     Row Name 11/28/23 1002 12/11/23 1116         ITP Comments Initial phone call completed. Diagnosis can be found in [facility] 12/17. EP Orientation scheduled for Wednesday 1/22 at 8:30. Completed and gym orientation. Initial ITP created and sent for review to Dr. Bethann Punches, Medical Director.               Comments: Initial ITP

## 2023-12-11 NOTE — Patient Instructions (Addendum)
Patient Instructions  Patient Details  Name: Jackson Hoffman MRN: [id number] Date of Birth: 1976-06-30 Referring Provider:  Marcina Millard, MD  Below are your personal goals for exercise, nutrition, and risk factors. Our goal is to help you stay on track towards obtaining and maintaining these goals. We will be discussing your progress on these goals with you throughout the program.  Initial Exercise Prescription:  Initial Exercise Prescription - 12/11/23 1100       Date of Initial Exercise RX and Referring Provider   Date 12/11/23    Referring Provider Dr. Marcina Millard      Oxygen   Maintain Oxygen Saturation 88% or higher      Treadmill   MPH 2.4    Grade 0    Minutes 15    METs 2.84      REL-XR   Level 2    Speed 50    Minutes 15    METs 2.87      T5 Nustep   Level 1    SPM 80    Minutes 15    METs 2.87      Prescription Details   Frequency (times per week) 3    Duration Progress to 30 minutes of continuous aerobic without signs/symptoms of physical distress      Intensity   THRR 40-80% of Max Heartrate 107-151    Ratings of Perceived Exertion 11-13    Perceived Dyspnea 0-4      Progression   Progression Continue to progress workloads to maintain intensity without signs/symptoms of physical distress.      Resistance Training   Training Prescription Yes    Weight 7 lb    Reps 10-15             Exercise Goals: Frequency: Be able to perform aerobic exercise two to three times per week in program working toward 2-5 days per week of home exercise.  Intensity: Work with a perceived exertion of 11 (fairly light) - 15 (hard) while following your exercise prescription.  We will make changes to your prescription with you as you progress through the program.   Duration: Be able to do 30 to 45 minutes of continuous aerobic exercise in addition to a 5 minute warm-up and a 5 minute cool-down routine.   Nutrition Goals: Your personal nutrition  goals will be established when you do your nutrition analysis with the dietician.  The following are general nutrition guidelines to follow: Cholesterol < 200mg /day Sodium < 1500mg /day Fiber: Men under 50 yrs - 38 grams per day  Personal Goals:  Personal Goals and Risk Factors at Admission - 11/28/23 1003       Core Components/Risk Factors/Patient Goals on Admission    Weight Management Yes;Weight Loss    Intervention Weight Management: Develop a combined nutrition and exercise program designed to reach desired caloric intake, while maintaining appropriate intake of nutrient and fiber, sodium and fats, and appropriate energy expenditure required for the weight goal.;Weight Management: Provide education and appropriate resources to help participant work on and attain dietary goals.;Weight Management/Obesity: Establish reasonable short term and long term weight goals.;Obesity: Provide education and appropriate resources to help participant work on and attain dietary goals.    Expected Outcomes Long Term: Adherence to nutrition and physical activity/exercise program aimed toward attainment of established weight goal;Short Term: Continue to assess and modify interventions until short term weight is achieved;Weight Loss: Understanding of general recommendations for a balanced deficit meal plan, which promotes 1-2 lb weight  loss per week and includes a negative energy balance of [id number] kcal/d;Understanding recommendations for meals to include 15-35% energy as protein, 25-35% energy from fat, 35-60% energy from carbohydrates, less than 200mg  of dietary cholesterol, 20-35 gm of total fiber daily;Understanding of distribution of calorie intake throughout the day with the consumption of 4-5 meals/snacks    Hypertension Yes    Intervention Provide education on lifestyle modifcations including regular physical activity/exercise, weight management, moderate sodium restriction and increased consumption of fresh  fruit, vegetables, and low fat dairy, alcohol moderation, and smoking cessation.;Monitor prescription use compliance.    Expected Outcomes Short Term: Continued assessment and intervention until BP is < 140/18mm HG in hypertensive participants. < 130/51mm HG in hypertensive participants with diabetes, heart failure or chronic kidney disease.;Long Term: Maintenance of blood pressure at goal levels.    Lipids Yes    Intervention Provide education and support for participant on nutrition & aerobic/resistive exercise along with prescribed medications to achieve LDL 70mg , HDL >40mg .    Expected Outcomes Short Term: Participant states understanding of desired cholesterol values and is compliant with medications prescribed. Participant is following exercise prescription and nutrition guidelines.;Long Term: Cholesterol controlled with medications as prescribed, with individualized exercise RX and with personalized nutrition plan. Value goals: LDL < 70mg , HDL > 40 mg.            Exercise Goals and Review:  Exercise Goals     Row Name 12/11/23 1126             Exercise Goals   Increase Physical Activity Yes       Intervention Provide advice, education, support and counseling about physical activity/exercise needs.;Develop an individualized exercise prescription for aerobic and resistive training based on initial evaluation findings, risk stratification, comorbidities and participant's personal goals.       Expected Outcomes Short Term: Attend rehab on a regular basis to increase amount of physical activity.;Long Term: Add in home exercise to make exercise part of routine and to increase amount of physical activity.;Long Term: Exercising regularly at least 3-5 days a week.       Increase Strength and Stamina Yes       Intervention Develop an individualized exercise prescription for aerobic and resistive training based on initial evaluation findings, risk stratification, comorbidities and participant's  personal goals.;Provide advice, education, support and counseling about physical activity/exercise needs.       Expected Outcomes Short Term: Increase workloads from initial exercise prescription for resistance, speed, and METs.;Long Term: Improve cardiorespiratory fitness, muscular endurance and strength as measured by increased METs and functional capacity ( );Short Term: Perform resistance training exercises routinely during rehab and add in resistance training at home       Able to understand and use rate of perceived exertion (RPE) scale Yes       Intervention Provide education and explanation on how to use RPE scale       Expected Outcomes Short Term: Able to use RPE daily in rehab to express subjective intensity level;Long Term:  Able to use RPE to guide intensity level when exercising independently       Able to understand and use Dyspnea scale Yes       Intervention Provide education and explanation on how to use Dyspnea scale       Expected Outcomes Short Term: Able to use Dyspnea scale daily in rehab to express subjective sense of shortness of breath during exertion;Long Term: Able to use Dyspnea scale to guide intensity level when exercising  independently       Knowledge and understanding of Target Heart Rate Range (THRR) Yes       Intervention Provide education and explanation of THRR including how the numbers were predicted and where they are located for reference       Expected Outcomes Short Term: Able to state/look up THRR;Long Term: Able to use THRR to govern intensity when exercising independently;Short Term: Able to use daily as guideline for intensity in rehab       Able to check pulse independently Yes       Intervention Provide education and demonstration on how to check pulse in carotid and radial arteries.;Review the importance of being able to check your own pulse for safety during independent exercise       Expected Outcomes Short Term: Able to explain why pulse checking is  important during independent exercise;Long Term: Able to check pulse independently and accurately       Understanding of Exercise Prescription Yes       Intervention Provide education, explanation, and written materials on patient's individual exercise prescription       Expected Outcomes Short Term: Able to explain program exercise prescription;Long Term: Able to explain home exercise prescription to exercise independently

## 2023-12-13 ENCOUNTER — Encounter: Payer: 59 | Admitting: *Deleted

## 2023-12-13 DIAGNOSIS — Z952 Presence of prosthetic heart valve: Secondary | ICD-10-CM

## 2023-12-13 DIAGNOSIS — Z48812 Encounter for surgical aftercare following surgery on the circulatory system: Secondary | ICD-10-CM | POA: Diagnosis not present

## 2023-12-13 NOTE — Progress Notes (Signed)
Daily Session Note  Patient Details  Name: Jackson Hoffman MRN: [id number] Date of Birth: 22-Dec-1975 Referring Provider:   Flowsheet Row Cardiac Rehab from 12/11/2023 in [facility] Cardiac and Pulmonary Rehab  Referring Provider Dr. Marcina Millard       Encounter Date: 12/13/2023  Check In:  Session Check In - 12/13/23 0748       Check-In   Supervising physician immediately available to respond to emergencies See telemetry face sheet for immediately available ER MD    Location [facility]-Cardiac & Pulmonary Rehab    Staff Present Cora Collum, RN, BSN, CCRP;Joseph Hood RCP,RRT,BSRT;Noah Tickle, [place], Exercise Physiologist    Virtual Visit No    Medication changes reported     No    Fall or balance concerns reported    No    Warm-up and Cool-down Performed on first and last piece of equipment    Resistance Training Performed Yes    VAD Patient? No    PAD/SET Patient? No      Pain Assessment   Currently in Pain? No/denies                Social History   Tobacco Use  Smoking Status Former   Average packs/day: 0.3 packs/day for 36.0 years (9.0 ttl pk-yrs)   Types: Cigarettes   Start date: 1984  Smokeless Tobacco Former   Quit date: 2000  Tobacco Comments   Dipped when he'd go fishing     Goals Met:  Exercise tolerated well Personal goals reviewed No report of concerns or symptoms today  Goals Unmet:  Not Applicable  Comments: First full day of exercise!  Patient was oriented to gym and equipment including functions, settings, policies, and procedures.  Patient's individual exercise prescription and treatment plan were reviewed.  All starting workloads were established based on the results of the 6 minute walk test done at initial orientation visit.  The plan for exercise progression was also introduced and progression will be customized based on patient's performance and goals. Pt able to follow exercise prescription today without complaint.  Will continue to  monitor for progression.    Dr. Bethann Punches is Medical Director for [facility] Cardiac Rehabilitation.  Dr. Vida Rigger is Medical Director for [facility] Pulmonary Rehabilitation.

## 2023-12-18 ENCOUNTER — Telehealth: Payer: Self-pay

## 2023-12-18 NOTE — Telephone Encounter (Signed)
Patient called to inform rehab team he was unable to come to Rehab on Monday and Wednesday (12/16/23,12/17/23) due to sick child. Reports he will attend on Friday.

## 2023-12-25 ENCOUNTER — Encounter: Payer: Self-pay | Admitting: *Deleted

## 2023-12-25 ENCOUNTER — Encounter: Payer: 59 | Attending: Cardiology | Admitting: *Deleted

## 2023-12-25 DIAGNOSIS — Z952 Presence of prosthetic heart valve: Secondary | ICD-10-CM | POA: Insufficient documentation

## 2023-12-25 DIAGNOSIS — Z48812 Encounter for surgical aftercare following surgery on the circulatory system: Secondary | ICD-10-CM | POA: Diagnosis present

## 2023-12-25 NOTE — Progress Notes (Signed)
Daily Session Note  Patient Details  Name: Jackson Hoffman MRN: [id number] Date of Birth: 20-Jun-1976 Referring Provider:   Flowsheet Row Cardiac Rehab from 12/11/2023 in [facility] Cardiac and Pulmonary Rehab  Referring Provider Dr. Marcina Millard       Encounter Date: 12/25/2023  Check In:  Session Check In - 12/25/23 1122       Check-In   Supervising physician immediately available to respond to emergencies See telemetry face sheet for immediately available ER MD    Location [facility]-Cardiac & Pulmonary Rehab    Staff Present Rory Percy, MS, Exercise Physiologist;Raun Routh Jewel Baize RN,BSN;Joseph Reino Kent RCP,RRT,BSRT    Virtual Visit No    Medication changes reported     No    Fall or balance concerns reported    No    Warm-up and Cool-down Performed on first and last piece of equipment    Resistance Training Performed Yes    VAD Patient? No    PAD/SET Patient? No      Pain Assessment   Currently in Pain? No/denies                Social History   Tobacco Use  Smoking Status Former   Average packs/day: 0.3 packs/day for 36.0 years (9.0 ttl pk-yrs)   Types: Cigarettes   Start date: 1984  Smokeless Tobacco Former   Quit date: 2000  Tobacco Comments   Dipped when he'd go fishing     Goals Met:  Independence with exercise equipment Exercise tolerated well No report of concerns or symptoms today Strength training completed today  Goals Unmet:  Not Applicable  Comments: Pt able to follow exercise prescription today without complaint.  Will continue to monitor for progression.    Dr. Bethann Punches is Medical Director for [facility] Cardiac Rehabilitation.  Dr. Vida Rigger is Medical Director for [facility] Pulmonary Rehabilitation.

## 2023-12-25 NOTE — Progress Notes (Signed)
Cardiac Individual Treatment Plan  Patient Details  Name: Jackson Hoffman MRN: [id number] Date of Birth: 1976/07/03 Referring Provider:   Flowsheet Row Cardiac Rehab from 12/11/2023 in [facility] Cardiac and Pulmonary Rehab  Referring Provider Dr. Marcina Millard       Initial Encounter Date:  Flowsheet Row Cardiac Rehab from 12/11/2023 in [facility] Cardiac and Pulmonary Rehab  Date 12/11/23       Visit Diagnosis: S/P AVR (aortic valve replacement)  Patient's Home Medications on Admission:  Current Outpatient Medications:    albuterol (VENTOLIN HFA) 108 (90 Base) MCG/ACT inhaler, Inhale into the lungs. (Patient not taking: Reported on 11/28/2023), Disp: , Rfl:    amLODipine (NORVASC) 10 MG tablet, Take 1 tablet by mouth daily., Disp: , Rfl:    MAGNESIUM GLYCINATE PO, Take 400 mg by mouth 3 (three) times a week., Disp: , Rfl:    methocarbamol (ROBAXIN) 500 MG tablet, Take 500 mg by mouth 3 (three) times daily as needed., Disp: , Rfl:    metoprolol tartrate (LOPRESSOR) 25 MG tablet, Take 50 mg by mouth 2 (two) times daily., Disp: , Rfl:    Milk Thistle 1000 MG CAPS, Take 1,000 mg by mouth daily., Disp: , Rfl:    olmesartan (BENICAR) 5 MG tablet, Take 15 mg by mouth at bedtime., Disp: , Rfl:    omeprazole (PRILOSEC OTC) 20 MG tablet, Take 20 mg by mouth 3 (three) times a week., Disp: , Rfl:    Probiotic Product (PROBIOTIC PO), Take 1 capsule by mouth daily., Disp: , Rfl:    Vitamin D-Vitamin K (VITAMIN K2-VITAMIN D3 PO), Take 1 tablet by mouth daily. D3 4000 units K2 100 mg, Disp: , Rfl:    warfarin (COUMADIN) 2 MG tablet, Take 7 mg by mouth daily., Disp: , Rfl:   Past Medical History: Past Medical History:  Diagnosis Date   Hypertension    Renal disorder     Tobacco Use: Social History   Tobacco Use  Smoking Status Former   Average packs/day: 0.3 packs/day for 36.0 years (9.0 ttl pk-yrs)   Types: Cigarettes   Start date: 1984  Smokeless Tobacco Former   Quit date: 2000   Tobacco Comments   Dipped when he'd go fishing     Labs: Review Flowsheet        No data to display           Exercise Target Goals: Exercise Program Goal: Individual exercise prescription set using results from initial 6 min walk test and THRR while considering  patient's activity barriers and safety.   Exercise Prescription Goal: Initial exercise prescription builds to 30-45 minutes a day of aerobic activity, 2-3 days per week.  Home exercise guidelines will be given to patient during program as part of exercise prescription that the participant will acknowledge.   Education: Aerobic Exercise: - Group verbal and visual presentation on the components of exercise prescription. Introduces F.I.T.T principle from ACSM for exercise prescriptions.  Reviews F.I.T.T. principles of aerobic exercise including progression. Written material given at graduation. Flowsheet Row Cardiac Rehab from 12/11/2023 in [facility] Cardiac and Pulmonary Rehab  Education need identified 12/11/23       Education: Resistance Exercise: - Group verbal and visual presentation on the components of exercise prescription. Introduces F.I.T.T principle from ACSM for exercise prescriptions  Reviews F.I.T.T. principles of resistance exercise including progression. Written material given at graduation.    Education: Exercise & Equipment Safety: - Individual verbal instruction and demonstration of equipment use and safety with use  of the equipment. Flowsheet Row Cardiac Rehab from 12/11/2023 in [facility] Cardiac and Pulmonary Rehab  Date 12/11/23  Educator NT  Instruction Review Code 1- Verbalizes Understanding       Education: Exercise Physiology & General Exercise Guidelines: - Group verbal and written instruction with models to review the exercise physiology of the cardiovascular system and associated critical values. Provides general exercise guidelines with specific guidelines to those with heart or lung disease.     Education: Flexibility, Balance, Mind/Body Relaxation: - Group verbal and visual presentation with interactive activity on the components of exercise prescription. Introduces F.I.T.T principle from ACSM for exercise prescriptions. Reviews F.I.T.T. principles of flexibility and balance exercise training including progression. Also discusses the mind body connection.  Reviews various relaxation techniques to help reduce and manage stress (i.e. Deep breathing, progressive muscle relaxation, and visualization). Balance handout provided to take home. Written material given at graduation.   Activity Barriers & Risk Stratification:  Activity Barriers & Cardiac Risk Stratification - 11/28/23 1025       Activity Barriers & Cardiac Risk Stratification   Activity Barriers None    Cardiac Risk Stratification Moderate   complicated recovery from AVR, HTN issues            6 Minute Walk:  6 Minute Walk     Row Name 12/11/23 1124         6 Minute Walk   Phase Initial     Distance 1130 feet     Walk Time 6 minutes     # of Rest Breaks 0     MPH 2.14     METS 2.87     RPE 11     Perceived Dyspnea  1     VO2 Peak 10.03     Symptoms Yes (comment)     Comments chest sensitivity     Resting HR 64 bpm     Resting BP 104/56     Resting Oxygen Saturation  96 %     Exercise Oxygen Saturation  during 6 min walk 96 %     Max Ex. HR 92 bpm     Max Ex. BP 118/58     2 Minute Post BP 100/56              Oxygen Initial Assessment:   Oxygen Re-Evaluation:   Oxygen Discharge (Final Oxygen Re-Evaluation):   Initial Exercise Prescription:  Initial Exercise Prescription - 12/11/23 1100       Date of Initial Exercise RX and Referring Provider   Date 12/11/23    Referring Provider Dr. Marcina Millard      Oxygen   Maintain Oxygen Saturation 88% or higher      Treadmill   MPH 2.4    Grade 0    Minutes 15    METs 2.84      REL-XR   Level 2    Speed 50    Minutes 15     METs 2.87      T5 Nustep   Level 1    SPM 80    Minutes 15    METs 2.87      Prescription Details   Frequency (times per week) 3    Duration Progress to 30 minutes of continuous aerobic without signs/symptoms of physical distress      Intensity   THRR 40-80% of Max Heartrate 107-151    Ratings of Perceived Exertion 11-13    Perceived Dyspnea 0-4  Progression   Progression Continue to progress workloads to maintain intensity without signs/symptoms of physical distress.      Resistance Training   Training Prescription Yes    Weight 7 lb    Reps 10-15             Perform Capillary Blood Glucose checks as needed.  Exercise Prescription Changes:   Exercise Prescription Changes     Row Name 12/11/23 1100             Response to Exercise   Blood Pressure (Admit) 104/56       Blood Pressure (Exercise) 118/58       Blood Pressure (Exit) 100/56       Heart Rate (Admit) 64 bpm       Heart Rate (Exercise) 92 bpm       Heart Rate (Exit) 64 bpm       Oxygen Saturation (Admit) 96 %       Oxygen Saturation (Exercise) 96 %       Rating of Perceived Exertion (Exercise) 11       Perceived Dyspnea (Exercise) 1       Symptoms chest sensitivity       Comments Results                Exercise Comments:   Exercise Comments     Row Name 12/13/23 0749           Exercise Comments First full day of exercise!  Patient was oriented to gym and equipment including functions, settings, policies, and procedures.  Patient's individual exercise prescription and treatment plan were reviewed.  All starting workloads were established based on the results of the 6 minute walk test done at initial orientation visit.  The plan for exercise progression was also introduced and progression will be customized based on patient's performance and goals.                Exercise Goals and Review:   Exercise Goals     Row Name 12/11/23 1126             Exercise Goals    Increase Physical Activity Yes       Intervention Provide advice, education, support and counseling about physical activity/exercise needs.;Develop an individualized exercise prescription for aerobic and resistive training based on initial evaluation findings, risk stratification, comorbidities and participant's personal goals.       Expected Outcomes Short Term: Attend rehab on a regular basis to increase amount of physical activity.;Long Term: Add in home exercise to make exercise part of routine and to increase amount of physical activity.;Long Term: Exercising regularly at least 3-5 days a week.       Increase Strength and Stamina Yes       Intervention Develop an individualized exercise prescription for aerobic and resistive training based on initial evaluation findings, risk stratification, comorbidities and participant's personal goals.;Provide advice, education, support and counseling about physical activity/exercise needs.       Expected Outcomes Short Term: Increase workloads from initial exercise prescription for resistance, speed, and METs.;Long Term: Improve cardiorespiratory fitness, muscular endurance and strength as measured by increased METs and functional capacity ( );Short Term: Perform resistance training exercises routinely during rehab and add in resistance training at home       Able to understand and use rate of perceived exertion (RPE) scale Yes       Intervention Provide education and explanation on how to use RPE scale  Expected Outcomes Short Term: Able to use RPE daily in rehab to express subjective intensity level;Long Term:  Able to use RPE to guide intensity level when exercising independently       Able to understand and use Dyspnea scale Yes       Intervention Provide education and explanation on how to use Dyspnea scale       Expected Outcomes Short Term: Able to use Dyspnea scale daily in rehab to express subjective sense of shortness of breath during  exertion;Long Term: Able to use Dyspnea scale to guide intensity level when exercising independently       Knowledge and understanding of Target Heart Rate Range (THRR) Yes       Intervention Provide education and explanation of THRR including how the numbers were predicted and where they are located for reference       Expected Outcomes Short Term: Able to state/look up THRR;Long Term: Able to use THRR to govern intensity when exercising independently;Short Term: Able to use daily as guideline for intensity in rehab       Able to check pulse independently Yes       Intervention Provide education and demonstration on how to check pulse in carotid and radial arteries.;Review the importance of being able to check your own pulse for safety during independent exercise       Expected Outcomes Short Term: Able to explain why pulse checking is important during independent exercise;Long Term: Able to check pulse independently and accurately       Understanding of Exercise Prescription Yes       Intervention Provide education, explanation, and written materials on patient's individual exercise prescription       Expected Outcomes Short Term: Able to explain program exercise prescription;Long Term: Able to explain home exercise prescription to exercise independently                Exercise Goals Re-Evaluation :  Exercise Goals Re-Evaluation     Row Name 12/13/23 0749             Exercise Goal Re-Evaluation   Exercise Goals Review Able to understand and use rate of perceived exertion (RPE) scale;Able to understand and use Dyspnea scale;Knowledge and understanding of Target Heart Rate Range (THRR);Understanding of Exercise Prescription       Comments Reviewed RPE and dyspnea scale, THR and program prescription with pt today.  Pt voiced understanding and was given a copy of goals to take home.       Expected Outcomes Short: Use RPE daily to regulate intensity. Long: Follow program prescription in  THR.                Discharge Exercise Prescription (Final Exercise Prescription Changes):  Exercise Prescription Changes - 12/11/23 1100       Response to Exercise   Blood Pressure (Admit) 104/56    Blood Pressure (Exercise) 118/58    Blood Pressure (Exit) 100/56    Heart Rate (Admit) 64 bpm    Heart Rate (Exercise) 92 bpm    Heart Rate (Exit) 64 bpm    Oxygen Saturation (Admit) 96 %    Oxygen Saturation (Exercise) 96 %    Rating of Perceived Exertion (Exercise) 11    Perceived Dyspnea (Exercise) 1    Symptoms chest sensitivity    Comments Results             Nutrition:  Target Goals: Understanding of nutrition guidelines, daily intake of sodium 1500mg , cholesterol 200mg ,  calories 30% from fat and 7% or less from saturated fats, daily to have 5 or more servings of fruits and vegetables.  Education: All About Nutrition: -Group instruction provided by verbal, written material, interactive activities, discussions, models, and posters to present general guidelines for heart healthy nutrition including fat, fiber, MyPlate, the role of sodium in heart healthy nutrition, utilization of the nutrition label, and utilization of this knowledge for meal planning. Follow up email sent as well. Written material given at graduation.   Biometrics:  Pre Biometrics - 12/11/23 1129       Pre Biometrics   Height 5' 7.25" (1.708 m)    Weight 268 lb (121.6 kg)    Waist Circumference 46 inches    Hip Circumference 48 inches    Waist to Hip Ratio 0.96 %    BMI (Calculated) 41.67    Single Leg Stand 30 seconds              Nutrition Therapy Plan and Nutrition Goals:  Nutrition Therapy & Goals - 12/11/23 1121       Intervention Plan   Intervention Prescribe, educate and counsel regarding individualized specific dietary modifications aiming towards targeted core components such as weight, hypertension, lipid management, diabetes, heart failure and other comorbidities.     Expected Outcomes Short Term Goal: Understand basic principles of dietary content, such as calories, fat, sodium, cholesterol and nutrients.;Short Term Goal: A plan has been developed with personal nutrition goals set during dietitian appointment.;Long Term Goal: Adherence to prescribed nutrition plan.             Nutrition Assessments:  MEDIFICTS Score Key: >=70 Need to make dietary changes  40-70 Heart Healthy Diet <= 40 Therapeutic Level Cholesterol Diet  Flowsheet Row Cardiac Rehab from 12/11/2023 in [facility] Cardiac and Pulmonary Rehab  Picture Your Plate Total Score on Admission 60      Picture Your Plate Scores: <16 Unhealthy dietary pattern with much room for improvement. 41-50 Dietary pattern unlikely to meet recommendations for good health and room for improvement. 51-60 More healthful dietary pattern, with some room for improvement.  >60 Healthy dietary pattern, although there may be some specific behaviors that could be improved.    Nutrition Goals Re-Evaluation:   Nutrition Goals Discharge (Final Nutrition Goals Re-Evaluation):   Psychosocial: Target Goals: Acknowledge presence or absence of significant depression and/or stress, maximize coping skills, provide positive support system. Participant is able to verbalize types and ability to use techniques and skills needed for reducing stress and depression.   Education: Stress, Anxiety, and Depression - Group verbal and visual presentation to define topics covered.  Reviews how body is impacted by stress, anxiety, and depression.  Also discusses healthy ways to reduce stress and to treat/manage anxiety and depression.  Written material given at graduation.   Education: Sleep Hygiene -Provides group verbal and written instruction about how sleep can affect your health.  Define sleep hygiene, discuss sleep cycles and impact of sleep habits. Review good sleep hygiene tips.    Initial Review & Psychosocial Screening:   Initial Psych Review & Screening - 11/28/23 1007       Initial Review   Current issues with Current Stress Concerns;Current Sleep Concerns      Family Dynamics   Good Support System? Yes      Barriers   Psychosocial barriers to participate in program There are no identifiable barriers or psychosocial needs.      Screening Interventions   Interventions Encouraged to exercise  Expected Outcomes Short Term goal: Utilizing psychosocial counselor, staff and physician to assist with identification of specific Stressors or current issues interfering with healing process. Setting desired goal for each stressor or current issue identified.;Long Term Goal: Stressors or current issues are controlled or eliminated.;Short Term goal: Identification and review with participant of any Quality of Life or Depression concerns found by scoring the questionnaire.;Long Term goal: The participant improves quality of Life and PHQ9 Scores as seen by post scores and/or verbalization of changes             Quality of Life Scores:   Quality of Life - 12/11/23 1120       Quality of Life   Select Quality of Life      Quality of Life Scores   Health/Function Pre 20.8 %    Socioeconomic Pre 20.31 %    Psych/Spiritual Pre 20.57 %    Family Pre 26.4 %    GLOBAL Pre 21.44 %            Scores of 19 and below usually indicate a poorer quality of life in these areas.  A difference of  2-3 points is a clinically meaningful difference.  A difference of 2-3 points in the total score of the Quality of Life Index has been associated with significant improvement in overall quality of life, self-image, physical symptoms, and general health in studies assessing change in quality of life.  PHQ-9: Review Flowsheet       12/11/2023  Depression screen PHQ 2/9  Decreased Interest 0  Down, Depressed, Hopeless 0  PHQ - 2 Score 0  Altered sleeping 1  Tired, decreased energy 2  Change in appetite 1  Feeling bad or  failure about yourself  0  Trouble concentrating 1  Moving slowly or fidgety/restless 1  Suicidal thoughts 0  PHQ-9 Score 6  Difficult doing work/chores Not difficult at all   Interpretation of Total Score  Total Score Depression Severity:  1-4 = Minimal depression, 5-9 = Mild depression, 10-14 = Moderate depression, 15-19 = Moderately severe depression, 20-27 = Severe depression   Psychosocial Evaluation and Intervention:  Psychosocial Evaluation - 11/28/23 1036       Psychosocial Evaluation & Interventions   Interventions Encouraged to exercise with the program and follow exercise prescription;Stress management education;Relaxation education    Comments Mr. Lax is coming to cardiac rehab after an aortic valve replacement. He states recovery has been challenging regarding his blood pressure. He has been battling with high blood pressure since the hospital and he is slowly weaning himself off some of his medication under doctor supervision. He is ready to get back to his normal life, but knows that healing takes time. His copays might limit the length of the program, but he is ready to learn what he should be doing and more about nutrition. He enjoys hiking with his family and friends and really wants to get back to it. His sleep has improved as he reduces the amount of metoprolol, but he is still having to get up to use the bathroom frequently which is new since surgery. He sees his doctor tomorrow and plans to ask some of his questions. He is motivated to attend the program for as long as he can financially.    Expected Outcomes Short: attend cardiac rehab for education and exercise. Long: develop and maintain positive self care habtis.    Continue Psychosocial Services  Follow up required by staff  Psychosocial Re-Evaluation:   Psychosocial Discharge (Final Psychosocial Re-Evaluation):   Vocational Rehabilitation: Provide vocational rehab assistance to qualifying  candidates.   Vocational Rehab Evaluation & Intervention:  Vocational Rehab - 11/28/23 1004       Initial Vocational Rehab Evaluation & Intervention   Assessment shows need for Vocational Rehabilitation No             Education: Education Goals: Education classes will be provided on a variety of topics geared toward better understanding of heart health and risk factor modification. Participant will state understanding/return demonstration of topics presented as noted by education test scores.  Learning Barriers/Preferences:  Learning Barriers/Preferences - 11/28/23 1004       Learning Barriers/Preferences   Learning Barriers None    Learning Preferences None             General Cardiac Education Topics:  AED/CPR: - Group verbal and written instruction with the use of models to demonstrate the basic use of the AED with the basic ABC's of resuscitation.   Anatomy and Cardiac Procedures: - Group verbal and visual presentation and models provide information about basic cardiac anatomy and function. Reviews the testing methods done to diagnose heart disease and the outcomes of the test results. Describes the treatment choices: Medical Management, Angioplasty, or Coronary Bypass Surgery for treating various heart conditions including Myocardial Infarction, Angina, Valve Disease, and Cardiac Arrhythmias.  Written material given at graduation.   Medication Safety: - Group verbal and visual instruction to review commonly prescribed medications for heart and lung disease. Reviews the medication, class of the drug, and side effects. Includes the steps to properly store meds and maintain the prescription regimen.  Written material given at graduation.   Intimacy: - Group verbal instruction through game format to discuss how heart and lung disease can affect sexual intimacy. Written material given at graduation..   Know Your Numbers and Heart Failure: - Group verbal and visual  instruction to discuss disease risk factors for cardiac and pulmonary disease and treatment options.  Reviews associated critical values for Overweight/Obesity, Hypertension, Cholesterol, and Diabetes.  Discusses basics of heart failure: signs/symptoms and treatments.  Introduces Heart Failure Zone chart for action plan for heart failure.  Written material given at graduation.   Infection Prevention: - Provides verbal and written material to individual with discussion of infection control including proper hand washing and proper equipment cleaning during exercise session. Flowsheet Row Cardiac Rehab from 12/11/2023 in [facility] Cardiac and Pulmonary Rehab  Date 12/11/23  Educator NT  Instruction Review Code 1- Verbalizes Understanding       Falls Prevention: - Provides verbal and written material to individual with discussion of falls prevention and safety. Flowsheet Row Cardiac Rehab from 12/11/2023 in [facility] Cardiac and Pulmonary Rehab  Date 12/11/23  Educator NT  Instruction Review Code 1- Verbalizes Understanding       Other: -Provides group and verbal instruction on various topics (see comments)   Knowledge Questionnaire Score:  Knowledge Questionnaire Score - 12/11/23 1120       Knowledge Questionnaire Score   Pre Score 25/26             Core Components/Risk Factors/Patient Goals at Admission:  Personal Goals and Risk Factors at Admission - 11/28/23 1003       Core Components/Risk Factors/Patient Goals on Admission    Weight Management Yes;Weight Loss    Intervention Weight Management: Develop a combined nutrition and exercise program designed to reach desired caloric intake, while maintaining appropriate intake  of nutrient and fiber, sodium and fats, and appropriate energy expenditure required for the weight goal.;Weight Management: Provide education and appropriate resources to help participant work on and attain dietary goals.;Weight Management/Obesity: Establish  reasonable short term and long term weight goals.;Obesity: Provide education and appropriate resources to help participant work on and attain dietary goals.    Expected Outcomes Long Term: Adherence to nutrition and physical activity/exercise program aimed toward attainment of established weight goal;Short Term: Continue to assess and modify interventions until short term weight is achieved;Weight Loss: Understanding of general recommendations for a balanced deficit meal plan, which promotes 1-2 lb weight loss per week and includes a negative energy balance of [phone] kcal/d;Understanding recommendations for meals to include 15-35% energy as protein, 25-35% energy from fat, 35-60% energy from carbohydrates, less than 200mg  of dietary cholesterol, 20-35 gm of total fiber daily;Understanding of distribution of calorie intake throughout the day with the consumption of 4-5 meals/snacks    Hypertension Yes    Intervention Provide education on lifestyle modifcations including regular physical activity/exercise, weight management, moderate sodium restriction and increased consumption of fresh fruit, vegetables, and low fat dairy, alcohol moderation, and smoking cessation.;Monitor prescription use compliance.    Expected Outcomes Short Term: Continued assessment and intervention until BP is < 140/37mm HG in hypertensive participants. < 130/72mm HG in hypertensive participants with diabetes, heart failure or chronic kidney disease.;Long Term: Maintenance of blood pressure at goal levels.    Lipids Yes    Intervention Provide education and support for participant on nutrition & aerobic/resistive exercise along with prescribed medications to achieve LDL 70mg , HDL >40mg .    Expected Outcomes Short Term: Participant states understanding of desired cholesterol values and is compliant with medications prescribed. Participant is following exercise prescription and nutrition guidelines.;Long Term: Cholesterol controlled  with medications as prescribed, with individualized exercise RX and with personalized nutrition plan. Value goals: LDL < 70mg , HDL > 40 mg.             Education:Diabetes - Individual verbal and written instruction to review signs/symptoms of diabetes, desired ranges of glucose level fasting, after meals and with exercise. Acknowledge that pre and post exercise glucose checks will be done for 3 sessions at entry of program.   Core Components/Risk Factors/Patient Goals Review:    Core Components/Risk Factors/Patient Goals at Discharge (Final Review):    ITP Comments:  ITP Comments     Row Name 11/28/23 1002 12/11/23 1116 12/13/23 0749 12/25/23 1126     ITP Comments Initial phone call completed. Diagnosis can be found in [facility] 12/17. EP Orientation scheduled for Wednesday 1/22 at 8:30. Completed and gym orientation. Initial ITP created and sent for review to Dr. Bethann Punches, Medical Director. First full day of exercise!  Patient was oriented to gym and equipment including functions, settings, policies, and procedures.  Patient's individual exercise prescription and treatment plan were reviewed.  All starting workloads were established based on the results of the 6 minute walk test done at initial orientation visit.  The plan for exercise progression was also introduced and progression will be customized based on patient's performance and goals. 30 Day review completed. Medical Director ITP review done, changes made as directed, and signed approval by Medical Director.    new to program             Comments:

## 2023-12-27 DIAGNOSIS — Z48812 Encounter for surgical aftercare following surgery on the circulatory system: Secondary | ICD-10-CM | POA: Diagnosis not present

## 2023-12-27 DIAGNOSIS — Z952 Presence of prosthetic heart valve: Secondary | ICD-10-CM

## 2023-12-27 NOTE — Progress Notes (Signed)
Daily Session Note  Patient Details  Name: Jackson Hoffman MRN: [id number] Date of Birth: 1976-10-18 Referring Provider:   Flowsheet Row Cardiac Rehab from 12/11/2023 in [facility] Cardiac and Pulmonary Rehab  Referring Provider Dr. Marcina Millard       Encounter Date: 12/27/2023  Check In:  Session Check In - 12/27/23 1103       Check-In   Supervising physician immediately available to respond to emergencies See telemetry face sheet for immediately available ER MD    Location [facility]-Cardiac & Pulmonary Rehab    Staff Present Kelton Pillar RN,BSN,MPA;Noah Tickle, BS, Exercise Physiologist;Joseph Hollace Kinnier    Virtual Visit No    Medication changes reported     No    Fall or balance concerns reported    No    Warm-up and Cool-down Performed on first and last piece of equipment    Resistance Training Performed Yes    VAD Patient? No    PAD/SET Patient? No      Pain Assessment   Currently in Pain? No/denies                Social History   Tobacco Use  Smoking Status Former   Average packs/day: 0.3 packs/day for 36.0 years (9.0 ttl pk-yrs)   Types: Cigarettes   Start date: 1984  Smokeless Tobacco Former   Quit date: 2000  Tobacco Comments   Dipped when he'd go fishing     Goals Met:  Independence with exercise equipment Exercise tolerated well No report of concerns or symptoms today Strength training completed today  Goals Unmet:  Not Applicable  Comments: Pt able to follow exercise prescription today without complaint.  Will continue to monitor for progression.    Dr. Bethann Punches is Medical Director for [facility] Cardiac Rehabilitation.  Dr. Vida Rigger is Medical Director for [facility] Pulmonary Rehabilitation.

## 2024-01-08 ENCOUNTER — Encounter: Payer: 59 | Admitting: *Deleted

## 2024-01-08 DIAGNOSIS — Z48812 Encounter for surgical aftercare following surgery on the circulatory system: Secondary | ICD-10-CM | POA: Diagnosis not present

## 2024-01-08 DIAGNOSIS — Z952 Presence of prosthetic heart valve: Secondary | ICD-10-CM

## 2024-01-08 NOTE — Progress Notes (Signed)
 Daily Session Note  Patient Details  Name: Jackson Hoffman MRN: [id number] Date of Birth: [birth year] Referring Provider:   Flowsheet Row Cardiac Rehab from 12/11/2023 in [facility] Cardiac and Pulmonary Rehab  Referring Provider Dr. Marcina Millard       Encounter Date: 01/08/2024  Check In:  Session Check In - 01/08/24 1128       Check-In   Supervising physician immediately available to respond to emergencies See telemetry face sheet for immediately available ER MD    Location [facility]-Cardiac & Pulmonary Rehab    Staff Present Elige Ko RCP,RRT,BSRT;Rollie Hynek [facility] RN,BSN;Margaret Best, MS, Exercise Physiologist    Virtual Visit No    Medication changes reported     No    Fall or balance concerns reported    No    Warm-up and Cool-down Performed on first and last piece of equipment    Resistance Training Performed Yes    VAD Patient? No    PAD/SET Patient? No      Pain Assessment   Currently in Pain? No/denies                Social History   Tobacco Use  Smoking Status Former   Average packs/day: 0.3 packs/day for 36.0 years (9.0 ttl pk-yrs)   Types: Cigarettes   Start date: 1984  Smokeless Tobacco Former   Quit date: 2000  Tobacco Comments   Dipped when he'd go fishing     Goals Met:  Independence with exercise equipment Exercise tolerated well No report of concerns or symptoms today Strength training completed today  Goals Unmet:  Not Applicable  Comments: Pt able to follow exercise prescription today without complaint.  Will continue to monitor for progression.    Dr. Bethann Punches is Medical Director for [facility] Cardiac Rehabilitation.  Dr. Vida Rigger is Medical Director for [facility] Pulmonary Rehabilitation.

## 2024-01-10 ENCOUNTER — Encounter: Payer: 59 | Admitting: *Deleted

## 2024-01-10 DIAGNOSIS — Z48812 Encounter for surgical aftercare following surgery on the circulatory system: Secondary | ICD-10-CM | POA: Diagnosis not present

## 2024-01-10 DIAGNOSIS — Z952 Presence of prosthetic heart valve: Secondary | ICD-10-CM

## 2024-01-10 NOTE — Progress Notes (Signed)
 Daily Session Note  Patient Details  Name: MAXINE HUYNH MRN: [id number] Date of Birth: 09-Jul-1976 Referring Provider:   Flowsheet Row Cardiac Rehab from 12/11/2023 in [facility] Cardiac and Pulmonary Rehab  Referring Provider Dr. Marcina Millard       Encounter Date: 01/10/2024  Check In:  Session Check In - 01/10/24 1107       Check-In   Supervising physician immediately available to respond to emergencies See telemetry face sheet for immediately available ER MD    Location [facility]-Cardiac & Pulmonary Rehab    Staff Present Susann Givens RN,BSN;Joseph Weyman [place], [place], Exercise Physiologist    Virtual Visit No    Medication changes reported     No    Fall or balance concerns reported    No    Warm-up and Cool-down Performed on first and last piece of equipment    Resistance Training Performed Yes    VAD Patient? No    PAD/SET Patient? No      Pain Assessment   Currently in Pain? No/denies                Social History   Tobacco Use  Smoking Status Former   Average packs/day: 0.3 packs/day for 36.0 years (9.0 ttl pk-yrs)   Types: Cigarettes   Start date: 1984  Smokeless Tobacco Former   Quit date: 2000  Tobacco Comments   Dipped when he'd go fishing     Goals Met:  Independence with exercise equipment Exercise tolerated well No report of concerns or symptoms today Strength training completed today  Goals Unmet:  Not Applicable  Comments: Pt able to follow exercise prescription today without complaint.  Will continue to monitor for progression.    Dr. Bethann Punches is Medical Director for [facility] Cardiac Rehabilitation.  Dr. Vida Rigger is Medical Director for [facility] Pulmonary Rehabilitation.

## 2024-01-13 ENCOUNTER — Encounter: Payer: 59 | Attending: Cardiology | Admitting: *Deleted

## 2024-01-13 DIAGNOSIS — Z952 Presence of prosthetic heart valve: Secondary | ICD-10-CM | POA: Diagnosis present

## 2024-01-13 NOTE — Progress Notes (Signed)
 Daily Session Note  Patient Details  Name: Jackson Hoffman MRN: [id number] Date of Birth: 19-Jun-1976 Referring Provider:   Flowsheet Row Cardiac Rehab from 12/11/2023 in [facility] Cardiac and Pulmonary Rehab  Referring Provider Dr. Marcina Millard       Encounter Date: 01/13/2024  Check In:  Session Check In - 01/13/24 1131       Check-In   Supervising physician immediately available to respond to emergencies See telemetry face sheet for immediately available ER MD    Location [facility]-Cardiac & Pulmonary Rehab    Staff Present Susann Givens RN,BSN;Joseph [facility] RCP,RRT,BSRT;Margaret Best, MS, Exercise Physiologist;Kelly Cloretta Ned, ACSM CEP, Exercise Physiologist    Virtual Visit No    Medication changes reported     No    Fall or balance concerns reported    No    Warm-up and Cool-down Performed on first and last piece of equipment    Resistance Training Performed Yes    VAD Patient? No    PAD/SET Patient? No      Pain Assessment   Currently in Pain? No/denies                Social History   Tobacco Use  Smoking Status Former   Average packs/day: 0.3 packs/day for 36.0 years (9.0 ttl pk-yrs)   Types: Cigarettes   Start date: 1984  Smokeless Tobacco Former   Quit date: 2000  Tobacco Comments   Dipped when he'd go fishing     Goals Met:  Independence with exercise equipment Exercise tolerated well No report of concerns or symptoms today Strength training completed today  Goals Unmet:  Not Applicable  Comments: Pt able to follow exercise prescription today without complaint.  Will continue to monitor for progression.    Dr. Bethann Punches is Medical Director for [facility] Cardiac Rehabilitation.  Dr. Vida Rigger is Medical Director for [facility] Pulmonary Rehabilitation.

## 2024-01-17 DIAGNOSIS — Z952 Presence of prosthetic heart valve: Secondary | ICD-10-CM

## 2024-01-17 NOTE — Progress Notes (Signed)
 Daily Session Note  Patient Details  Name: Jackson Hoffman MRN: [id number] Date of Birth: Mar 16, 1976 Referring Provider:   Flowsheet Row Cardiac Rehab from 12/11/2023 in [facility] Cardiac and Pulmonary Rehab  Referring Provider Dr. Marcina Millard       Encounter Date: 01/17/2024  Check In:  Session Check In - 01/17/24 1053       Check-In   Supervising physician immediately available to respond to emergencies See telemetry face sheet for immediately available ER MD    Location [facility]-Cardiac & Pulmonary Rehab    Staff Present Kelton Pillar RN,BSN,MPA;Noah Tickle, BS, Exercise Physiologist;Joseph Hollace Kinnier    Virtual Visit No    Medication changes reported     No    Fall or balance concerns reported    No    Warm-up and Cool-down Performed on first and last piece of equipment    Resistance Training Performed Yes    VAD Patient? No    PAD/SET Patient? No      Pain Assessment   Currently in Pain? No/denies                Social History   Tobacco Use  Smoking Status Former   Average packs/day: 0.3 packs/day for 36.0 years (9.0 ttl pk-yrs)   Types: Cigarettes   Start date: 1984  Smokeless Tobacco Former   Quit date: 2000  Tobacco Comments   Dipped when he'd go fishing     Goals Met:  Independence with exercise equipment Exercise tolerated well No report of concerns or symptoms today Strength training completed today  Goals Unmet:  Not Applicable  Comments: Pt able to follow exercise prescription today without complaint.  Will continue to monitor for progression.    Dr. Bethann Punches is Medical Director for [facility] Cardiac Rehabilitation.  Dr. Vida Rigger is Medical Director for [facility] Pulmonary Rehabilitation.

## 2024-01-22 ENCOUNTER — Encounter: Payer: Self-pay | Admitting: *Deleted

## 2024-01-22 DIAGNOSIS — Z952 Presence of prosthetic heart valve: Secondary | ICD-10-CM

## 2024-01-22 NOTE — Progress Notes (Signed)
 Cardiac Individual Treatment Plan  Patient Details  Name: Jackson Hoffman MRN: [id number] Date of Birth: Apr 30, 1976 Referring Provider:   Flowsheet Row Cardiac Rehab from 12/11/2023 in [facility] Cardiac and Pulmonary Rehab  Referring Provider Dr. Marcina Millard       Initial Encounter Date:  Flowsheet Row Cardiac Rehab from 12/11/2023 in [facility] Cardiac and Pulmonary Rehab  Date 12/11/23       Visit Diagnosis: S/P AVR (aortic valve replacement)  Patient's Home Medications on Admission:  Current Outpatient Medications:    albuterol (VENTOLIN HFA) 108 (90 Base) MCG/ACT inhaler, Inhale into the lungs. (Patient not taking: Reported on 11/28/2023), Disp: , Rfl:    amLODipine (NORVASC) 10 MG tablet, Take 1 tablet by mouth daily., Disp: , Rfl:    MAGNESIUM GLYCINATE PO, Take 400 mg by mouth 3 (three) times a week., Disp: , Rfl:    methocarbamol (ROBAXIN) 500 MG tablet, Take 500 mg by mouth 3 (three) times daily as needed., Disp: , Rfl:    metoprolol tartrate (LOPRESSOR) 25 MG tablet, Take 50 mg by mouth 2 (two) times daily., Disp: , Rfl:    Milk Thistle 1000 MG CAPS, Take 1,000 mg by mouth daily., Disp: , Rfl:    olmesartan (BENICAR) 5 MG tablet, Take 15 mg by mouth at bedtime., Disp: , Rfl:    omeprazole (PRILOSEC OTC) 20 MG tablet, Take 20 mg by mouth 3 (three) times a week., Disp: , Rfl:    Probiotic Product (PROBIOTIC PO), Take 1 capsule by mouth daily., Disp: , Rfl:    Vitamin D-Vitamin K (VITAMIN K2-VITAMIN D3 PO), Take 1 tablet by mouth daily. D3 4000 units K2 100 mg, Disp: , Rfl:    warfarin (COUMADIN) 2 MG tablet, Take 7 mg by mouth daily., Disp: , Rfl:   Past Medical History: Past Medical History:  Diagnosis Date   Hypertension    Renal disorder     Tobacco Use: Social History   Tobacco Use  Smoking Status Former   Average packs/day: 0.3 packs/day for 36.0 years (9.0 ttl pk-yrs)   Types: Cigarettes   Start date: 1984  Smokeless Tobacco Former   Quit date: 2000   Tobacco Comments   Dipped when he'd go fishing     Labs: Review Flowsheet        No data to display           Exercise Target Goals: Exercise Program Goal: Individual exercise prescription set using results from initial 6 min walk test and THRR while considering  patient's activity barriers and safety.   Exercise Prescription Goal: Initial exercise prescription builds to 30-45 minutes a day of aerobic activity, 2-3 days per week.  Home exercise guidelines will be given to patient during program as part of exercise prescription that the participant will acknowledge.   Education: Aerobic Exercise: - Group verbal and visual presentation on the components of exercise prescription. Introduces F.I.T.T principle from ACSM for exercise prescriptions.  Reviews F.I.T.T. principles of aerobic exercise including progression. Written material given at graduation. Flowsheet Row Cardiac Rehab from 12/11/2023 in [facility] Cardiac and Pulmonary Rehab  Education need identified 12/11/23       Education: Resistance Exercise: - Group verbal and visual presentation on the components of exercise prescription. Introduces F.I.T.T principle from ACSM for exercise prescriptions  Reviews F.I.T.T. principles of resistance exercise including progression. Written material given at graduation.    Education: Exercise & Equipment Safety: - Individual verbal instruction and demonstration of equipment use and safety with use  of the equipment. Flowsheet Row Cardiac Rehab from 12/11/2023 in [facility] Cardiac and Pulmonary Rehab  Date 12/11/23  Educator NT  Instruction Review Code 1- Verbalizes Understanding       Education: Exercise Physiology & General Exercise Guidelines: - Group verbal and written instruction with models to review the exercise physiology of the cardiovascular system and associated critical values. Provides general exercise guidelines with specific guidelines to those with heart or lung disease.     Education: Flexibility, Balance, Mind/Body Relaxation: - Group verbal and visual presentation with interactive activity on the components of exercise prescription. Introduces F.I.T.T principle from ACSM for exercise prescriptions. Reviews F.I.T.T. principles of flexibility and balance exercise training including progression. Also discusses the mind body connection.  Reviews various relaxation techniques to help reduce and manage stress (i.e. Deep breathing, progressive muscle relaxation, and visualization). Balance handout provided to take home. Written material given at graduation.   Activity Barriers & Risk Stratification:  Activity Barriers & Cardiac Risk Stratification - 11/28/23 1025       Activity Barriers & Cardiac Risk Stratification   Activity Barriers None    Cardiac Risk Stratification Moderate   complicated recovery from AVR, HTN issues            6 Minute Walk:  6 Minute Walk     Row Name 12/11/23 1124         6 Minute Walk   Phase Initial     Distance 1130 feet     Walk Time 6 minutes     # of Rest Breaks 0     MPH 2.14     METS 2.87     RPE 11     Perceived Dyspnea  1     VO2 Peak 10.03     Symptoms Yes (comment)     Comments chest sensitivity     Resting HR 64 bpm     Resting BP 104/56     Resting Oxygen Saturation  96 %     Exercise Oxygen Saturation  during 6 min walk 96 %     Max Ex. HR 92 bpm     Max Ex. BP 118/58     2 Minute Post BP 100/56              Oxygen Initial Assessment:   Oxygen Re-Evaluation:   Oxygen Discharge (Final Oxygen Re-Evaluation):   Initial Exercise Prescription:  Initial Exercise Prescription - 12/11/23 1100       Date of Initial Exercise RX and Referring Provider   Date 12/11/23    Referring Provider Dr. Marcina Millard      Oxygen   Maintain Oxygen Saturation 88% or higher      Treadmill   MPH 2.4    Grade 0    Minutes 15    METs 2.84      REL-XR   Level 2    Speed 50    Minutes 15     METs 2.87      T5 Nustep   Level 1    SPM 80    Minutes 15    METs 2.87      Prescription Details   Frequency (times per week) 3    Duration Progress to 30 minutes of continuous aerobic without signs/symptoms of physical distress      Intensity   THRR 40-80% of Max Heartrate 107-151    Ratings of Perceived Exertion 11-13    Perceived Dyspnea 0-4  Progression   Progression Continue to progress workloads to maintain intensity without signs/symptoms of physical distress.      Resistance Training   Training Prescription Yes    Weight 7 lb    Reps 10-15             Perform Capillary Blood Glucose checks as needed.  Exercise Prescription Changes:   Exercise Prescription Changes     Row Name 12/11/23 1100 01/08/24 1100           Response to Exercise   Blood Pressure (Admit) 104/56 132/66      Blood Pressure (Exercise) 118/58 142/64      Blood Pressure (Exit) 100/56 118/64      Heart Rate (Admit) 64 bpm 104 bpm      Heart Rate (Exercise) 92 bpm 153 bpm      Heart Rate (Exit) 64 bpm 100 bpm      Oxygen Saturation (Admit) 96 % --      Oxygen Saturation (Exercise) 96 % --      Rating of Perceived Exertion (Exercise) 11 13      Perceived Dyspnea (Exercise) 1 0      Symptoms chest sensitivity none      Comments Results first two weeks of exercise      Duration -- Progress to 30 minutes of  aerobic without signs/symptoms of physical distress      Intensity -- THRR unchanged        Progression   Progression -- Continue to progress workloads to maintain intensity without signs/symptoms of physical distress.      Average METs -- 2.78        Resistance Training   Training Prescription -- Yes      Weight -- 7 lb      Reps -- 10-15        Interval Training   Interval Training -- No        Treadmill   MPH -- 2.5      Grade -- 1      Minutes -- 15      METs -- 3.26        REL-XR   Level -- 3      Minutes -- 15      METs -- 2.2        T5 Nustep    Level -- 4  T6      Minutes -- 15      METs -- 2.8        Oxygen   Maintain Oxygen Saturation -- 88% or higher               Exercise Comments:   Exercise Comments     Row Name 12/13/23 0749           Exercise Comments First full day of exercise!  Patient was oriented to gym and equipment including functions, settings, policies, and procedures.  Patient's individual exercise prescription and treatment plan were reviewed.  All starting workloads were established based on the results of the 6 minute walk test done at initial orientation visit.  The plan for exercise progression was also introduced and progression will be customized based on patient's performance and goals.                Exercise Goals and Review:   Exercise Goals     Row Name 12/11/23 1126             Exercise Goals  Increase Physical Activity Yes       Intervention Provide advice, education, support and counseling about physical activity/exercise needs.;Develop an individualized exercise prescription for aerobic and resistive training based on initial evaluation findings, risk stratification, comorbidities and participant's personal goals.       Expected Outcomes Short Term: Attend rehab on a regular basis to increase amount of physical activity.;Long Term: Add in home exercise to make exercise part of routine and to increase amount of physical activity.;Long Term: Exercising regularly at least 3-5 days a week.       Increase Strength and Stamina Yes       Intervention Develop an individualized exercise prescription for aerobic and resistive training based on initial evaluation findings, risk stratification, comorbidities and participant's personal goals.;Provide advice, education, support and counseling about physical activity/exercise needs.       Expected Outcomes Short Term: Increase workloads from initial exercise prescription for resistance, speed, and METs.;Long Term: Improve cardiorespiratory  fitness, muscular endurance and strength as measured by increased METs and functional capacity ( );Short Term: Perform resistance training exercises routinely during rehab and add in resistance training at home       Able to understand and use rate of perceived exertion (RPE) scale Yes       Intervention Provide education and explanation on how to use RPE scale       Expected Outcomes Short Term: Able to use RPE daily in rehab to express subjective intensity level;Long Term:  Able to use RPE to guide intensity level when exercising independently       Able to understand and use Dyspnea scale Yes       Intervention Provide education and explanation on how to use Dyspnea scale       Expected Outcomes Short Term: Able to use Dyspnea scale daily in rehab to express subjective sense of shortness of breath during exertion;Long Term: Able to use Dyspnea scale to guide intensity level when exercising independently       Knowledge and understanding of Target Heart Rate Range (THRR) Yes       Intervention Provide education and explanation of THRR including how the numbers were predicted and where they are located for reference       Expected Outcomes Short Term: Able to state/look up THRR;Long Term: Able to use THRR to govern intensity when exercising independently;Short Term: Able to use daily as guideline for intensity in rehab       Able to check pulse independently Yes       Intervention Provide education and demonstration on how to check pulse in carotid and radial arteries.;Review the importance of being able to check your own pulse for safety during independent exercise       Expected Outcomes Short Term: Able to explain why pulse checking is important during independent exercise;Long Term: Able to check pulse independently and accurately       Understanding of Exercise Prescription Yes       Intervention Provide education, explanation, and written materials on patient's individual exercise  prescription       Expected Outcomes Short Term: Able to explain program exercise prescription;Long Term: Able to explain home exercise prescription to exercise independently                Exercise Goals Re-Evaluation :  Exercise Goals Re-Evaluation     Row Name 12/13/23 0749 01/08/24 1113           Exercise Goal Re-Evaluation   Exercise Goals Review Able  to understand and use rate of perceived exertion (RPE) scale;Able to understand and use Dyspnea scale;Knowledge and understanding of Target Heart Rate Range (THRR);Understanding of Exercise Prescription Increase Physical Activity;Increase Strength and Stamina;Understanding of Exercise Prescription      Comments Reviewed RPE and dyspnea scale, THR and program prescription with pt today.  Pt voiced understanding and was given a copy of goals to take home. Jackson Hoffman is off to a good start in the program. He was was able to attend 2 sessions during this review period, and continues to get familiar with his exercise prescription. He was able to increase his level on the XR from 2 to 3. He was also able to increase to level 4 on the T6 nustep. We will continue to monitor his progress in the program.      Expected Outcomes Short: Use RPE daily to regulate intensity. Long: Follow program prescription in THR. Short: Continue to follow exercise prescription. Long: Continue exercise to improve strength and stamina.               Discharge Exercise Prescription (Final Exercise Prescription Changes):  Exercise Prescription Changes - 01/08/24 1100       Response to Exercise   Blood Pressure (Admit) 132/66    Blood Pressure (Exercise) 142/64    Blood Pressure (Exit) 118/64    Heart Rate (Admit) 104 bpm    Heart Rate (Exercise) 153 bpm    Heart Rate (Exit) 100 bpm    Rating of Perceived Exertion (Exercise) 13    Perceived Dyspnea (Exercise) 0    Symptoms none    Comments first two weeks of exercise    Duration Progress to 30 minutes of   aerobic without signs/symptoms of physical distress    Intensity THRR unchanged      Progression   Progression Continue to progress workloads to maintain intensity without signs/symptoms of physical distress.    Average METs 2.78      Resistance Training   Training Prescription Yes    Weight 7 lb    Reps 10-15      Interval Training   Interval Training No      Treadmill   MPH 2.5    Grade 1    Minutes 15    METs 3.26      REL-XR   Level 3    Minutes 15    METs 2.2      T5 Nustep   Level 4   T6   Minutes 15    METs 2.8      Oxygen   Maintain Oxygen Saturation 88% or higher             Nutrition:  Target Goals: Understanding of nutrition guidelines, daily intake of sodium 1500mg , cholesterol 200mg , calories 30% from fat and 7% or less from saturated fats, daily to have 5 or more servings of fruits and vegetables.  Education: All About Nutrition: -Group instruction provided by verbal, written material, interactive activities, discussions, models, and posters to present general guidelines for heart healthy nutrition including fat, fiber, MyPlate, the role of sodium in heart healthy nutrition, utilization of the nutrition label, and utilization of this knowledge for meal planning. Follow up email sent as well. Written material given at graduation.   Biometrics:  Pre Biometrics - 12/11/23 1129       Pre Biometrics   Height 5' 7.25" (1.708 m)    Weight 268 lb (121.6 kg)    Waist Circumference 46 inches  Hip Circumference 48 inches    Waist to Hip Ratio 0.96 %    BMI (Calculated) 41.67    Single Leg Stand 30 seconds              Nutrition Therapy Plan and Nutrition Goals:  Nutrition Therapy & Goals - 12/11/23 1121       Intervention Plan   Intervention Prescribe, educate and counsel regarding individualized specific dietary modifications aiming towards targeted core components such as weight, hypertension, lipid management, diabetes, heart failure  and other comorbidities.    Expected Outcomes Short Term Goal: Understand basic principles of dietary content, such as calories, fat, sodium, cholesterol and nutrients.;Short Term Goal: A plan has been developed with personal nutrition goals set during dietitian appointment.;Long Term Goal: Adherence to prescribed nutrition plan.             Nutrition Assessments:  MEDIFICTS Score Key: >=70 Need to make dietary changes  40-70 Heart Healthy Diet <= 40 Therapeutic Level Cholesterol Diet  Flowsheet Row Cardiac Rehab from 12/11/2023 in [facility] Cardiac and Pulmonary Rehab  Picture Your Plate Total Score on Admission 60      Picture Your Plate Scores: <14 Unhealthy dietary pattern with much room for improvement. 41-50 Dietary pattern unlikely to meet recommendations for good health and room for improvement. 51-60 More healthful dietary pattern, with some room for improvement.  >60 Healthy dietary pattern, although there may be some specific behaviors that could be improved.    Nutrition Goals Re-Evaluation:   Nutrition Goals Discharge (Final Nutrition Goals Re-Evaluation):   Psychosocial: Target Goals: Acknowledge presence or absence of significant depression and/or stress, maximize coping skills, provide positive support system. Participant is able to verbalize types and ability to use techniques and skills needed for reducing stress and depression.   Education: Stress, Anxiety, and Depression - Group verbal and visual presentation to define topics covered.  Reviews how body is impacted by stress, anxiety, and depression.  Also discusses healthy ways to reduce stress and to treat/manage anxiety and depression.  Written material given at graduation.   Education: Sleep Hygiene -Provides group verbal and written instruction about how sleep can affect your health.  Define sleep hygiene, discuss sleep cycles and impact of sleep habits. Review good sleep hygiene tips.    Initial Review  & Psychosocial Screening:  Initial Psych Review & Screening - 11/28/23 1007       Initial Review   Current issues with Current Stress Concerns;Current Sleep Concerns      Family Dynamics   Good Support System? Yes      Barriers   Psychosocial barriers to participate in program There are no identifiable barriers or psychosocial needs.      Screening Interventions   Interventions Encouraged to exercise    Expected Outcomes Short Term goal: Utilizing psychosocial counselor, staff and physician to assist with identification of specific Stressors or current issues interfering with healing process. Setting desired goal for each stressor or current issue identified.;Long Term Goal: Stressors or current issues are controlled or eliminated.;Short Term goal: Identification and review with participant of any Quality of Life or Depression concerns found by scoring the questionnaire.;Long Term goal: The participant improves quality of Life and PHQ9 Scores as seen by post scores and/or verbalization of changes             Quality of Life Scores:   Quality of Life - 12/11/23 1120       Quality of Life   Select Quality of Life  Quality of Life Scores   Health/Function Pre 20.8 %    Socioeconomic Pre 20.31 %    Psych/Spiritual Pre 20.57 %    Family Pre 26.4 %    GLOBAL Pre 21.44 %            Scores of 19 and below usually indicate a poorer quality of life in these areas.  A difference of  2-3 points is a clinically meaningful difference.  A difference of 2-3 points in the total score of the Quality of Life Index has been associated with significant improvement in overall quality of life, self-image, physical symptoms, and general health in studies assessing change in quality of life.  PHQ-9: Review Flowsheet       12/11/2023  Depression screen PHQ 2/9  Decreased Interest 0  Down, Depressed, Hopeless 0  PHQ - 2 Score 0  Altered sleeping 1  Tired, decreased energy 2  Change in  appetite 1  Feeling bad or failure about yourself  0  Trouble concentrating 1  Moving slowly or fidgety/restless 1  Suicidal thoughts 0  PHQ-9 Score 6  Difficult doing work/chores Not difficult at all   Interpretation of Total Score  Total Score Depression Severity:  1-4 = Minimal depression, 5-9 = Mild depression, 10-14 = Moderate depression, 15-19 = Moderately severe depression, 20-27 = Severe depression   Psychosocial Evaluation and Intervention:  Psychosocial Evaluation - 11/28/23 1036       Psychosocial Evaluation & Interventions   Interventions Encouraged to exercise with the program and follow exercise prescription;Stress management education;Relaxation education    Comments Mr. Yo is coming to cardiac rehab after an aortic valve replacement. He states recovery has been challenging regarding his blood pressure. He has been battling with high blood pressure since the hospital and he is slowly weaning himself off some of his medication under doctor supervision. He is ready to get back to his normal life, but knows that healing takes time. His copays might limit the length of the program, but he is ready to learn what he should be doing and more about nutrition. He enjoys hiking with his family and friends and really wants to get back to it. His sleep has improved as he reduces the amount of metoprolol, but he is still having to get up to use the bathroom frequently which is new since surgery. He sees his doctor tomorrow and plans to ask some of his questions. He is motivated to attend the program for as long as he can financially.    Expected Outcomes Short: attend cardiac rehab for education and exercise. Long: develop and maintain positive self care habtis.    Continue Psychosocial Services  Follow up required by staff             Psychosocial Re-Evaluation:   Psychosocial Discharge (Final Psychosocial Re-Evaluation):   Vocational Rehabilitation: Provide vocational  rehab assistance to qualifying candidates.   Vocational Rehab Evaluation & Intervention:  Vocational Rehab - 11/28/23 1004       Initial Vocational Rehab Evaluation & Intervention   Assessment shows need for Vocational Rehabilitation No             Education: Education Goals: Education classes will be provided on a variety of topics geared toward better understanding of heart health and risk factor modification. Participant will state understanding/return demonstration of topics presented as noted by education test scores.  Learning Barriers/Preferences:  Learning Barriers/Preferences - 11/28/23 1004       Learning Barriers/Preferences  Learning Barriers None    Learning Preferences None             General Cardiac Education Topics:  AED/CPR: - Group verbal and written instruction with the use of models to demonstrate the basic use of the AED with the basic ABC's of resuscitation.   Anatomy and Cardiac Procedures: - Group verbal and visual presentation and models provide information about basic cardiac anatomy and function. Reviews the testing methods done to diagnose heart disease and the outcomes of the test results. Describes the treatment choices: Medical Management, Angioplasty, or Coronary Bypass Surgery for treating various heart conditions including Myocardial Infarction, Angina, Valve Disease, and Cardiac Arrhythmias.  Written material given at graduation.   Medication Safety: - Group verbal and visual instruction to review commonly prescribed medications for heart and lung disease. Reviews the medication, class of the drug, and side effects. Includes the steps to properly store meds and maintain the prescription regimen.  Written material given at graduation.   Intimacy: - Group verbal instruction through game format to discuss how heart and lung disease can affect sexual intimacy. Written material given at graduation..   Know Your Numbers and Heart  Failure: - Group verbal and visual instruction to discuss disease risk factors for cardiac and pulmonary disease and treatment options.  Reviews associated critical values for Overweight/Obesity, Hypertension, Cholesterol, and Diabetes.  Discusses basics of heart failure: signs/symptoms and treatments.  Introduces Heart Failure Zone chart for action plan for heart failure.  Written material given at graduation.   Infection Prevention: - Provides verbal and written material to individual with discussion of infection control including proper hand washing and proper equipment cleaning during exercise session. Flowsheet Row Cardiac Rehab from 12/11/2023 in [facility] Cardiac and Pulmonary Rehab  Date 12/11/23  Educator NT  Instruction Review Code 1- Verbalizes Understanding       Falls Prevention: - Provides verbal and written material to individual with discussion of falls prevention and safety. Flowsheet Row Cardiac Rehab from 12/11/2023 in [facility] Cardiac and Pulmonary Rehab  Date 12/11/23  Educator NT  Instruction Review Code 1- Verbalizes Understanding       Other: -Provides group and verbal instruction on various topics (see comments)   Knowledge Questionnaire Score:

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
## 2024-01-22 NOTE — Progress Notes (Signed)
 Cardiac Individual Treatment Plan  Patient Details  Name: Jackson Hoffman MRN: [id number] Date of Birth: June 09, 1976 Referring Provider:   Flowsheet Row Cardiac Rehab from 12/11/2023 in [facility] Cardiac and Pulmonary Rehab  Referring Provider Dr. Marcina Millard       Initial Encounter Date:  Flowsheet Row Cardiac Rehab from 12/11/2023 in [facility]iac and Pulmonary Rehab  Date 12/11/23       Visit Diagnosis: S/P AVR (aortic valve replacement)  Patient's Home Medications on Admission:  Current Outpatient Medications:    albuterol (VENTOLIN HFA) 108 (90 Base) MCG/ACT inhaler, Inhale into the lungs. (Patient not taking: Reported on 11/28/2023), Disp: , Rfl:    amLODipine (NORVASC) 10 MG tablet, Take 1 tablet by mouth daily., Disp: , Rfl:    MAGNESIUM GLYCINATE PO, Take 400 mg by mouth 3 (three) times a week., Disp: , Rfl:    methocarbamol (ROBAXIN) 500 MG tablet, Take 500 mg by mouth 3 (three) times daily as needed., Disp: , Rfl:    metoprolol tartrate (LOPRESSOR) 25 MG tablet, Take 50 mg by mouth 2 (two) times daily., Disp: , Rfl:    Milk Thistle 1000 MG CAPS, Take 1,000 mg by mouth daily., Disp: , Rfl:    olmesartan (BENICAR) 5 MG tablet, Take 15 mg by mouth at bedtime., Disp: , Rfl:    omeprazole (PRILOSEC OTC) 20 MG tablet, Take 20 mg by mouth 3 (three) times a week., Disp: , Rfl:    Probiotic Product (PROBIOTIC PO), Take 1 capsule by mouth daily., Disp: , Rfl:    Vitamin D-Vitamin K (VITAMIN K2-VITAMIN D3 PO), Take 1 tablet by mouth daily. D3 4000 units K2 100 mg, Disp: , Rfl:    warfarin (COUMADIN) 2 MG tablet, Take 7 mg by mouth daily., Disp: , Rfl:   Past Medical History: Past Medical History:  Diagnosis Date   Hypertension    Renal disorder     Tobacco Use: Social History   Tobacco Use  Smoking Status Former   Average packs/day: 0.3 packs/day for 36.0 years (9.0 ttl pk-yrs)   Types: Cigarettes   Start date: 1984  Smokeless Tobacco Former   Quit date: 2000   Tobacco Comments   Dipped when he'd go fishing     Labs: Review Flowsheet        No data to display           Exercise Target Goals: Exercise Program Goal: Individual exercise prescription set using results from initial 6 min walk test and THRR while considering  patient's activity barriers and safety.   Exercise Prescription Goal: Initial exercise prescription builds to 30-45 minutes a day of aerobic activity, 2-3 days per week.  Home exercise guidelines will be given to patient during program as part of exercise prescription that the participant will acknowledge.   Education: Aerobic Exercise: - Group verbal and visual presentation on the components of exercise prescription. Introduces F.I.T.T principle from ACSM for exercise prescriptions.  Reviews F.I.T.T. principles of aerobic exercise including progression. Written material given at graduation. Flowsheet Row Cardiac Rehab from 12/11/2023 in [facility] Cardiac [facility] Pulmonary Rehab  Education need identified 12/11/23       Education: Resistance Exercise: - Group verbal and visual presentation on the components of exercise prescription. Introduces F.I.T.T principle from ACSM for exercise prescriptions  Reviews F.I.T.T. principles of resistance exercise including progression. Written material given at graduation.    Education: Exercise & Equipment Safety: - Individual verbal instruction and demonstration of equipment use and safety with use  of the equipment. Flowsheet Row Cardiac Rehab from 12/11/2023 in [facility] Cardiac and Pulmonary Rehab  Date 12/11/23  Educator NT  Instruction Review Code 1- Verbalizes Understanding       Education: Exercise Physiology & General Exercise Guidelines: - Group verbal and written instruction with models to review the exercise physiology of the cardiovascular system and associated critical values. Provides general exercise guidelines with specific guidelines to those with heart or lung disease.     Education: Flexibility, Balance, Mind/Body Relaxation: - Group verbal and visual presentation with interactive activity on the components of exercise prescription. Introduces F.I.T.T principle from ACSM for exercise prescriptions. Reviews F.I.T.T. principles of flexibility and balance exercise training including progression. Also discusses the mind body connection.  Reviews various relaxation techniques to help reduce and manage stress (i.e. Deep breathing, progressive muscle relaxation, and visualization). Balance handout provided to take home. Written material given at graduation.   Activity Barriers & Risk Stratification:  Activity Barriers & Cardiac Risk Stratification - 11/28/23 1025       Activity Barriers & Cardiac Risk Stratification   Activity Barriers None    Cardiac Risk Stratification Moderate   complicated recovery from AVR, HTN issues            6 Minute Walk:  6 Minute Walk     Row Name 12/11/23 1124         6 Minute Walk   Phase Initial     Distance 1130 feet     Walk Time 6 minutes     # of Rest Breaks 0     MPH 2.14     METS 2.87     RPE 11     Perceived Dyspnea  1     VO2 Peak 10.03     Symptoms Yes (comment)     Comments chest sensitivity     Resting HR 64 bpm     Resting BP 104/56     Resting Oxygen Saturation  96 %     Exercise Oxygen Saturation  during 6 min walk 96 %     Max Ex. HR 92 bpm     Max Ex. BP 118/58     2 Minute Post BP 100/56              Oxygen Initial Assessment:   Oxygen Re-Evaluation:   Oxygen Discharge (Final Oxygen Re-Evaluation):   Initial Exercise Prescription:  Initial Exercise Prescription - 12/11/23 1100       Date of Initial Exercise RX and Referring Provider   Date 12/11/23    Referring Provider Dr. Marcina Millard      Oxygen   Maintain Oxygen Saturation 88% or higher      Treadmill   MPH 2.4    Grade 0    Minutes 15    METs 2.84      REL-XR   Level 2    Speed 50    Minutes 15     METs 2.87      T5 Nustep   Level 1    SPM 80    Minutes 15    METs 2.87      Prescription Details   Frequency (times per week) 3    Duration Progress to 30 minutes of continuous aerobic without signs/symptoms of physical distress      Intensity   THRR 40-80% of Max Heartrate 107-151    Ratings of Perceived Exertion 11-13    Perceived Dyspnea 0-4  Progression   Progression Continue to progress workloads to maintain intensity without signs/symptoms of physical distress.      Resistance Training   Training Prescription Yes    Weight 7 lb    Reps 10-15             Perform Capillary Blood Glucose checks as needed.  Exercise Prescription Changes:   Exercise Prescription Changes     Row Name 12/11/23 1100 01/08/24 1100           Response to Exercise   Blood Pressure (Admit) 104/56 132/66      Blood Pressure (Exercise) 118/58 142/64      Blood Pressure (Exit) 100/56 118/64      Heart Rate (Admit) 64 bpm 104 bpm      Heart Rate (Exercise) 92 bpm 153 bpm      Heart Rate (Exit) 64 bpm 100 bpm      Oxygen Saturation (Admit) 96 % --      Oxygen Saturation (Exercise) 96 % --      Rating of Perceived Exertion (Exercise) 11 13      Perceived Dyspnea (Exercise) 1 0      Symptoms chest sensitivity none      Comments Results first two weeks of exercise      Duration -- Progress to 30 minutes of  aerobic without signs/symptoms of physical distress      Intensity -- THRR unchanged        Progression   Progression -- Continue to progress workloads to maintain intensity without signs/symptoms of physical distress.      Average METs -- 2.78        Resistance Training   Training Prescription -- Yes      Weight -- 7 lb      Reps -- 10-15        Interval Training   Interval Training -- No        Treadmill   MPH -- 2.5      Grade -- 1      Minutes -- 15      METs -- 3.26        REL-XR   Level -- 3      Minutes -- 15      METs -- 2.2        T5 Nustep    Level -- 4  T6      Minutes -- 15      METs -- 2.8        Oxygen   Maintain Oxygen Saturation -- 88% or higher               Exercise Comments:   Exercise Comments     Row Name 12/13/23 0749           Exercise Comments First full day of exercise!  Patient was oriented to gym and equipment including functions, settings, policies, and procedures.  Patient's individual exercise prescription and treatment plan were reviewed.  All starting workloads were established based on the results of the 6 minute walk test done at initial orientation visit.  The plan for exercise progression was also introduced and progression will be customized based on patient's performance and goals.                Exercise Goals and Review:   Exercise Goals     Row Name 12/11/23 1126             Exercise Goals  Increase Physical Activity Yes       Intervention Provide advice, education, support and counseling about physical activity/exercise needs.;Develop an individualized exercise prescription for aerobic and resistive training based on initial evaluation findings, risk stratification, comorbidities and participant's personal goals.       Expected Outcomes Short Term: Attend rehab on a regular basis to increase amount of physical activity.;Long Term: Add in home exercise to make exercise part of routine and to increase amount of physical activity.;Long Term: Exercising regularly at least 3-5 days a week.       Increase Strength and Stamina Yes       Intervention Develop an individualized exercise prescription for aerobic and resistive training based on initial evaluation findings, risk stratification, comorbidities and participant's personal goals.;Provide advice, education, support and counseling about physical activity/exercise needs.       Expected Outcomes Short Term: Increase workloads from initial exercise prescription for resistance, speed, and METs.;Long Term: Improve cardiorespiratory  fitness, muscular endurance and strength as measured by increased METs and functional capacity ( );Short Term: Perform resistance training exercises routinely during rehab and add in resistance training at home       Able to understand and use rate of perceived exertion (RPE) scale Yes       Intervention Provide education and explanation on how to use RPE scale       Expected Outcomes Short Term: Able to use RPE daily in rehab to express subjective intensity level;Long Term:  Able to use RPE to guide intensity level when exercising independently       Able to understand and use Dyspnea scale Yes       Intervention Provide education and explanation on how to use Dyspnea scale       Expected Outcomes Short Term: Able to use Dyspnea scale daily in rehab to express subjective sense of shortness of breath during exertion;Long Term: Able to use Dyspnea scale to guide intensity level when exercising independently       Knowledge and understanding of Target Heart Rate Range (THRR) Yes       Intervention Provide education and explanation of THRR including how the numbers were predicted and where they are located for reference       Expected Outcomes Short Term: Able to state/look up THRR;Long Term: Able to use THRR to govern intensity when exercising independently;Short Term: Able to use daily as guideline for intensity in rehab       Able to check pulse independently Yes       Intervention Provide education and demonstration on how to check pulse in carotid and radial arteries.;Review the importance of being able to check your own pulse for safety during independent exercise       Expected Outcomes Short Term: Able to explain why pulse checking is important during independent exercise;Long Term: Able to check pulse independently and accurately       Understanding of Exercise Prescription Yes       Intervention Provide education, explanation, and written materials on patient's individual exercise  prescription       Expected Outcomes Short Term: Able to explain program exercise prescription;Long Term: Able to explain home exercise prescription to exercise independently                Exercise Goals Re-Evaluation :  Exercise Goals Re-Evaluation     Row Name 12/13/23 0749 01/08/24 1113           Exercise Goal Re-Evaluation   Exercise Goals Review Able  to understand and use rate of perceived exertion (RPE) scale;Able to understand and use Dyspnea scale;Knowledge and understanding of Target Heart Rate Range (THRR);Understanding of Exercise Prescription Increase Physical Activity;Increase Strength and Stamina;Understanding of Exercise Prescription      Comments Reviewed RPE and dyspnea scale, THR and program prescription with pt today.  Pt voiced understanding and was given a copy of goals to take home. Faye is off to a good start in the program. He was was able to attend 2 sessions during this review period, and continues to get familiar with his exercise prescription. He was able to increase his level on the XR from 2 to 3. He was also able to increase to level 4 on the T6 nustep. We will continue to monitor his progress in the program.      Expected Outcomes Short: Use RPE daily to regulate intensity. Long: Follow program prescription in THR. Short: Continue to follow exercise prescription. Long: Continue exercise to improve strength and stamina.               Discharge Exercise Prescription (Final Exercise Prescription Changes):  Exercise Prescription Changes - 01/08/24 1100       Response to Exercise   Blood Pressure (Admit) 132/66    Blood Pressure (Exercise) 142/64    Blood Pressure (Exit) 118/64    Heart Rate (Admit) 104 bpm    Heart Rate (Exercise) 153 bpm    Heart Rate (Exit) 100 bpm    Rating of Perceived Exertion (Exercise) 13    Perceived Dyspnea (Exercise) 0    Symptoms none    Comments first two weeks of exercise    Duration Progress to 30 minutes of   aerobic without signs/symptoms of physical distress    Intensity THRR unchanged      Progression   Progression Continue to progress workloads to maintain intensity without signs/symptoms of physical distress.    Average METs 2.78      Resistance Training   Training Prescription Yes    Weight 7 lb    Reps 10-15      Interval Training   Interval Training No      Treadmill   MPH 2.5    Grade 1    Minutes 15    METs 3.26      REL-XR   Level 3    Minutes 15    METs 2.2      T5 Nustep   Level 4   T6   Minutes 15    METs 2.8      Oxygen   Maintain Oxygen Saturation 88% or higher             Nutrition:  Target Goals: Understanding of nutrition guidelines, daily intake of sodium 1500mg , cholesterol 200mg , calories 30% from fat and 7% or less from saturated fats, daily to have 5 or more servings of fruits and vegetables.  Education: All About Nutrition: -Group instruction provided by verbal, written material, interactive activities, discussions, models, and posters to present general guidelines for heart healthy nutrition including fat, fiber, MyPlate, the role of sodium in heart healthy nutrition, utilization of the nutrition label, and utilization of this knowledge for meal planning. Follow up email sent as well. Written material given at graduation.   Biometrics:  Pre Biometrics - 12/11/23 1129       Pre Biometrics   Height 5' 7.25" (1.708 m)    Weight 268 lb (121.6 kg)    Waist Circumference 46 inches  Hip Circumference 48 inches    Waist to Hip Ratio 0.96 %    BMI (Calculated) 41.67    Single Leg Stand 30 seconds              Nutrition Therapy Plan and Nutrition Goals:  Nutrition Therapy & Goals - 12/11/23 1121       Intervention Plan   Intervention Prescribe, educate and counsel regarding individualized specific dietary modifications aiming towards targeted core components such as weight, hypertension, lipid management, diabetes, heart failure  and other comorbidities.    Expected Outcomes Short Term Goal: Understand basic principles of dietary content, such as calories, fat, sodium, cholesterol and nutrients.;Short Term Goal: A plan has been developed with personal nutrition goals set during dietitian appointment.;Long Term Goal: Adherence to prescribed nutrition plan.             Nutrition Assessments:  MEDIFICTS Score Key: >=70 Need to make dietary changes  40-70 Heart Healthy Diet <= 40 Therapeutic Level Cholesterol Diet  Flowsheet Row Cardiac Rehab from 12/11/2023 in [facility] Cardiac and Pulmonary Rehab  Picture Your Plate Total Score on Admission 60      Picture Your Plate Scores: <40 Unhealthy dietary pattern with much room for improvement. 41-50 Dietary pattern unlikely to meet recommendations for good health and room for improvement. 51-60 More healthful dietary pattern, with some room for improvement.  >60 Healthy dietary pattern, although there may be some specific behaviors that could be improved.    Nutrition Goals Re-Evaluation:   Nutrition Goals Discharge (Final Nutrition Goals Re-Evaluation):   Psychosocial: Target Goals: Acknowledge presence or absence of significant depression and/or stress, maximize coping skills, provide positive support system. Participant is able to verbalize types and ability to use techniques and skills needed for reducing stress and depression.   Education: Stress, Anxiety, and Depression - Group verbal and visual presentation to define topics covered.  Reviews how body is impacted by stress, anxiety, and depression.  Also discusses healthy ways to reduce stress and to treat/manage anxiety and depression.  Written material given at graduation.   Education: Sleep Hygiene -Provides group verbal and written instruction about how sleep can affect your health.  Define sleep hygiene, discuss sleep cycles and impact of sleep habits. Review good sleep hygiene tips.    Initial Review  & Psychosocial Screening:  Initial Psych Review & Screening - 11/28/23 1007       Initial Review   Current issues with Current Stress Concerns;Current Sleep Concerns      Family Dynamics   Good Support System? Yes      Barriers   Psychosocial barriers to participate in program There are no identifiable barriers or psychosocial needs.      Screening Interventions   Interventions Encouraged to exercise    Expected Outcomes Short Term goal: Utilizing psychosocial counselor, staff and physician to assist with identification of specific Stressors or current issues interfering with healing process. Setting desired goal for each stressor or current issue identified.;Long Term Goal: Stressors or current issues are controlled or eliminated.;Short Term goal: Identification and review with participant of any Quality of Life or Depression concerns found by scoring the questionnaire.;Long Term goal: The participant improves quality of Life and PHQ9 Scores as seen by post scores and/or verbalization of changes             Quality of Life Scores:   Quality of Life - 12/11/23 1120       Quality of Life   Select Quality of Life  Quality of Life Scores   Health/Function Pre 20.8 %    Socioeconomic Pre 20.31 %    Psych/Spiritual Pre 20.57 %    Family Pre 26.4 %    GLOBAL Pre 21.44 %            Scores of 19 and below usually indicate a poorer quality of life in these areas.  A difference of  2-3 points is a clinically meaningful difference.  A difference of 2-3 points in the total score of the Quality of Life Index has been associated with significant improvement in overall quality of life, self-image, physical symptoms, and general health in studies assessing change in quality of life.  PHQ-9: Review Flowsheet       12/11/2023  Depression screen PHQ 2/9  Decreased Interest 0  Down, Depressed, Hopeless 0  PHQ - 2 Score 0  Altered sleeping 1  Tired, decreased energy 2  Change in  appetite 1  Feeling bad or failure about yourself  0  Trouble concentrating 1  Moving slowly or fidgety/restless 1  Suicidal thoughts 0  PHQ-9 Score 6  Difficult doing work/chores Not difficult at all   Interpretation of Total Score  Total Score Depression Severity:  1-4 = Minimal depression, 5-9 = Mild depression, 10-14 = Moderate depression, 15-19 = Moderately severe depression, 20-27 = Severe depression   Psychosocial Evaluation and Intervention:  Psychosocial Evaluation - 11/28/23 1036       Psychosocial Evaluation & Interventions   Interventions Encouraged to exercise with the program and follow exercise prescription;Stress management education;Relaxation education    Comments Mr. Parson is coming to cardiac rehab after an aortic valve replacement. He states recovery has been challenging regarding his blood pressure. He has been battling with high blood pressure since the hospital and he is slowly weaning himself off some of his medication under doctor supervision. He is ready to get back to his normal life, but knows that healing takes time. His copays might limit the length of the program, but he is ready to learn what he should be doing and more about nutrition. He enjoys hiking with his family and friends and really wants to get back to it. His sleep has improved as he reduces the amount of metoprolol, but he is still having to get up to use the bathroom frequently which is new since surgery. He sees his doctor tomorrow and plans to ask some of his questions. He is motivated to attend the program for as long as he can financially.    Expected Outcomes Short: attend cardiac rehab for education and exercise. Long: develop and maintain positive self care habtis.    Continue Psychosocial Services  Follow up required by staff             Psychosocial Re-Evaluation:   Psychosocial Discharge (Final Psychosocial Re-Evaluation):   Vocational Rehabilitation: Provide vocational  rehab assistance to qualifying candidates.   Vocational Rehab Evaluation & Intervention:  Vocational Rehab - 11/28/23 1004       Initial Vocational Rehab Evaluation & Intervention   Assessment shows need for Vocational Rehabilitation No             Education: Education Goals: Education classes will be provided on a variety of topics geared toward better understanding of heart health and risk factor modification. Participant will state understanding/return demonstration of topics presented as noted by education test scores.  Learning Barriers/Preferences:  Learning Barriers/Preferences - 11/28/23 1004       Learning Barriers/Preferences  Learning Barriers None    Learning Preferences None             General Cardiac Education Topics:  AED/CPR: - Group verbal and written instruction with the use of models to demonstrate the basic use of the AED with the basic ABC's of resuscitation.   Anatomy and Cardiac Procedures: - Group verbal and visual presentation and models provide information about basic cardiac anatomy and function. Reviews the testing methods done to diagnose heart disease and the outcomes of the test results. Describes the treatment choices: Medical Management, Angioplasty, or Coronary Bypass Surgery for treating various heart conditions including Myocardial Infarction, Angina, Valve Disease, and Cardiac Arrhythmias.  Written material given at graduation.   Medication Safety: - Group verbal and visual instruction to review commonly prescribed medications for heart and lung disease. Reviews the medication, class of the drug, and side effects. Includes the steps to properly store meds and maintain the prescription regimen.  Written material given at graduation.   Intimacy: - Group verbal instruction through game format to discuss how heart and lung disease can affect sexual intimacy. Written material given at graduation..   Know Your Numbers and Heart  Failure: - Group verbal and visual instruction to discuss disease risk factors for cardiac and pulmonary disease and treatment options.  Reviews associated critical values for Overweight/Obesity, Hypertension, Cholesterol, and Diabetes.  Discusses basics of heart failure: signs/symptoms and treatments.  Introduces Heart Failure Zone chart for action plan for heart failure.  Written material given at graduation.   Infection Prevention: - Provides verbal and written material to individual with discussion of infection control including proper hand washing and proper equipment cleaning during exercise session. Flowsheet Row Cardiac Rehab from 12/11/2023 in [facility] Cardiac and Pulmonary Rehab  Date 12/11/23  Educator NT  Instruction Review Code 1- Verbalizes Understanding       Falls Prevention: - Provides verbal and written material to individual with discussion of falls prevention and safety. Flowsheet Row Cardiac Rehab from 12/11/2023 in [facility] [facility]  Date 12/11/23  Educator NT  Instruction Review Code 1- Verbalizes Understanding       Other: -Provides group and verbal instruction on various topics (see comments)   Knowledge Questionnaire Score:  Knowledge Questionnaire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

## 2024-05-29 ENCOUNTER — Other Ambulatory Visit: Payer: Self-pay | Admitting: Sports Medicine

## 2024-05-29 DIAGNOSIS — M25522 Pain in left elbow: Secondary | ICD-10-CM

## 2024-05-29 DIAGNOSIS — S59902A Unspecified injury of left elbow, initial encounter: Secondary | ICD-10-CM

## 2024-06-01 ENCOUNTER — Encounter: Payer: Self-pay | Admitting: Sports Medicine

## 2024-06-09 ENCOUNTER — Ambulatory Visit
Admission: RE | Admit: 2024-06-09 | Discharge: 2024-06-09 | Disposition: A | Discharge status: Home / Self Care | Source: Ambulatory Visit | Attending: Sports Medicine | Admitting: Sports Medicine

## 2024-06-09 DIAGNOSIS — M25522 Pain in left elbow: Secondary | ICD-10-CM

## 2024-06-09 DIAGNOSIS — S59902A Unspecified injury of left elbow, initial encounter: Secondary | ICD-10-CM
